# Patient Record
Sex: Male | Born: 1960 | Race: White | Hispanic: No | Marital: Married | Smoking: Never smoker
Health system: Southern US, Community
[De-identification: ages and names within clinical notes are randomized; demographics above are authoritative.]

## PROBLEM LIST (undated history)

## (undated) DIAGNOSIS — I2510 Atherosclerotic heart disease of native coronary artery without angina pectoris: Secondary | ICD-10-CM

## (undated) DIAGNOSIS — E785 Hyperlipidemia, unspecified: Secondary | ICD-10-CM

## (undated) DIAGNOSIS — I255 Ischemic cardiomyopathy: Secondary | ICD-10-CM

## (undated) DIAGNOSIS — I219 Acute myocardial infarction, unspecified: Secondary | ICD-10-CM

## (undated) HISTORY — PX: TONSILLECTOMY: SUR1361

---

## 2013-10-30 ENCOUNTER — Emergency Department (HOSPITAL_BASED_OUTPATIENT_CLINIC_OR_DEPARTMENT_OTHER): Payer: BC Managed Care – PPO

## 2013-10-30 ENCOUNTER — Inpatient Hospital Stay (HOSPITAL_BASED_OUTPATIENT_CLINIC_OR_DEPARTMENT_OTHER)
Admission: EM | Admit: 2013-10-30 | Discharge: 2013-11-01 | DRG: 247 | Disposition: A | Discharge status: Home / Self Care | Payer: BC Managed Care – PPO | Attending: Cardiovascular Disease | Admitting: Cardiovascular Disease

## 2013-10-30 ENCOUNTER — Encounter (HOSPITAL_COMMUNITY)
Admission: EM | Disposition: A | Discharge status: Home / Self Care | Payer: Self-pay | Source: Home / Self Care | Attending: Cardiovascular Disease

## 2013-10-30 ENCOUNTER — Encounter (HOSPITAL_BASED_OUTPATIENT_CLINIC_OR_DEPARTMENT_OTHER): Payer: Self-pay | Admitting: Emergency Medicine

## 2013-10-30 ENCOUNTER — Ambulatory Visit (HOSPITAL_COMMUNITY)
Admission: EM | Admit: 2013-10-30 | Payer: BC Managed Care – PPO | Source: Other Acute Inpatient Hospital | Admitting: Cardiovascular Disease

## 2013-10-30 ENCOUNTER — Other Ambulatory Visit: Payer: Self-pay

## 2013-10-30 DIAGNOSIS — I714 Abdominal aortic aneurysm, without rupture, unspecified: Secondary | ICD-10-CM | POA: Diagnosis present

## 2013-10-30 DIAGNOSIS — K219 Gastro-esophageal reflux disease without esophagitis: Secondary | ICD-10-CM | POA: Diagnosis present

## 2013-10-30 DIAGNOSIS — E785 Hyperlipidemia, unspecified: Secondary | ICD-10-CM | POA: Diagnosis present

## 2013-10-30 DIAGNOSIS — I2129 ST elevation (STEMI) myocardial infarction involving other sites: Secondary | ICD-10-CM

## 2013-10-30 DIAGNOSIS — I213 ST elevation (STEMI) myocardial infarction of unspecified site: Secondary | ICD-10-CM | POA: Diagnosis present

## 2013-10-30 DIAGNOSIS — Z955 Presence of coronary angioplasty implant and graft: Secondary | ICD-10-CM

## 2013-10-30 DIAGNOSIS — I359 Nonrheumatic aortic valve disorder, unspecified: Secondary | ICD-10-CM

## 2013-10-30 DIAGNOSIS — I2119 ST elevation (STEMI) myocardial infarction involving other coronary artery of inferior wall: Principal | ICD-10-CM | POA: Diagnosis present

## 2013-10-30 DIAGNOSIS — I2510 Atherosclerotic heart disease of native coronary artery without angina pectoris: Secondary | ICD-10-CM

## 2013-10-30 DIAGNOSIS — I219 Acute myocardial infarction, unspecified: Secondary | ICD-10-CM

## 2013-10-30 DIAGNOSIS — I959 Hypotension, unspecified: Secondary | ICD-10-CM | POA: Diagnosis not present

## 2013-10-30 DIAGNOSIS — I2589 Other forms of chronic ischemic heart disease: Secondary | ICD-10-CM | POA: Diagnosis present

## 2013-10-30 DIAGNOSIS — I255 Ischemic cardiomyopathy: Secondary | ICD-10-CM | POA: Diagnosis present

## 2013-10-30 HISTORY — PX: LEFT HEART CATH: SHX5478

## 2013-10-30 HISTORY — DX: Hyperlipidemia, unspecified: E78.5

## 2013-10-30 HISTORY — DX: Atherosclerotic heart disease of native coronary artery without angina pectoris: I25.10

## 2013-10-30 HISTORY — DX: Ischemic cardiomyopathy: I25.5

## 2013-10-30 HISTORY — DX: Acute myocardial infarction, unspecified: I21.9

## 2013-10-30 HISTORY — PX: OTHER SURGICAL HISTORY: SHX169

## 2013-10-30 HISTORY — PX: PERCUTANEOUS CORONARY STENT INTERVENTION (PCI-S): SHX5485

## 2013-10-30 LAB — HEMOGLOBIN A1C
HEMOGLOBIN A1C: 5.5 % (ref <5.7)
MEAN PLASMA GLUCOSE: 111 mg/dL (ref <117)

## 2013-10-30 LAB — TROPONIN I
Troponin I: >20 ng/mL (ref <0.30)
Troponin I: >20 ng/mL (ref <0.30)

## 2013-10-30 LAB — CBC
HEMATOCRIT: 41.2 % (ref 39.0–52.0)
HEMOGLOBIN: 14 g/dL (ref 13.0–17.0)
MCH: 30.7 pg (ref 26.0–34.0)
MCHC: 34 g/dL (ref 30.0–36.0)
MCV: 90.4 fL (ref 78.0–100.0)
Platelets: 152 10*3/uL (ref 150–400)
RBC: 4.56 MIL/uL (ref 4.22–5.81)
RDW: 13.5 % (ref 11.5–15.5)
WBC: 7.1 10*3/uL (ref 4.0–10.5)

## 2013-10-30 LAB — CBC WITH DIFFERENTIAL/PLATELET
Basophils Absolute: 0 10*3/uL (ref 0.0–0.1)
Basophils Relative: 1 % (ref 0–1)
Eosinophils Absolute: 0.2 10*3/uL (ref 0.0–0.7)
Eosinophils Relative: 2 % (ref 0–5)
HCT: 46 % (ref 39.0–52.0)
Hemoglobin: 15.9 g/dL (ref 13.0–17.0)
LYMPHS ABS: 3.7 10*3/uL (ref 0.7–4.0)
LYMPHS PCT: 45 % (ref 12–46)
MCH: 30.5 pg (ref 26.0–34.0)
MCHC: 34.6 g/dL (ref 30.0–36.0)
MCV: 88.1 fL (ref 78.0–100.0)
MONOS PCT: 8 % (ref 3–12)
Monocytes Absolute: 0.7 10*3/uL (ref 0.1–1.0)
Neutro Abs: 3.6 10*3/uL (ref 1.7–7.7)
Neutrophils Relative %: 44 % (ref 43–77)
Platelets: 188 10*3/uL (ref 150–400)
RBC: 5.22 MIL/uL (ref 4.22–5.81)
RDW: 13.5 % (ref 11.5–15.5)
WBC: 8.2 10*3/uL (ref 4.0–10.5)

## 2013-10-30 LAB — MRSA PCR SCREENING: MRSA by PCR: NEGATIVE

## 2013-10-30 LAB — BASIC METABOLIC PANEL
Anion gap: 15 (ref 5–15)
Anion gap: 12 (ref 5–15)
BUN: 22 mg/dL (ref 6–23)
BUN: 19 mg/dL (ref 6–23)
CHLORIDE: 102 meq/L (ref 96–112)
CHLORIDE: 104 meq/L (ref 96–112)
CO2: 25 meq/L (ref 19–32)
CO2: 22 meq/L (ref 19–32)
Calcium: 10.1 mg/dL (ref 8.4–10.5)
Calcium: 9 mg/dL (ref 8.4–10.5)
Creatinine, Ser: 1.2 mg/dL (ref 0.50–1.35)
Creatinine, Ser: 0.95 mg/dL (ref 0.50–1.35)
GFR calc Af Amer: 79 mL/min — ABNORMAL LOW (ref >90)
GFR calc Af Amer: >90 mL/min (ref >90)
GFR calc non Af Amer: 68 mL/min — ABNORMAL LOW (ref >90)
GFR calc non Af Amer: >90 mL/min (ref >90)
Glucose, Bld: 117 mg/dL — ABNORMAL HIGH (ref 70–99)
Glucose, Bld: 99 mg/dL (ref 70–99)
Potassium: 3.8 mEq/L (ref 3.7–5.3)
Potassium: 4.2 mEq/L (ref 3.7–5.3)
Sodium: 142 mEq/L (ref 137–147)
Sodium: 138 mEq/L (ref 137–147)

## 2013-10-30 LAB — PROTIME-INR
INR: 1.02 (ref 0.00–1.49)
Prothrombin Time: 13.4 seconds (ref 11.6–15.2)

## 2013-10-30 LAB — LIPID PANEL
Cholesterol: 216 mg/dL — ABNORMAL HIGH (ref 0–200)
HDL: 38 mg/dL — AB (ref >39)
LDL Cholesterol: 146 mg/dL — ABNORMAL HIGH (ref 0–99)
Total CHOL/HDL Ratio: 5.7 RATIO
Triglycerides: 159 mg/dL — ABNORMAL HIGH (ref <150)
VLDL: 32 mg/dL (ref 0–40)

## 2013-10-30 LAB — POCT ACTIVATED CLOTTING TIME: ACTIVATED CLOTTING TIME: 422 s

## 2013-10-30 SURGERY — LEFT HEART CATH

## 2013-10-30 MED ORDER — PRASUGREL HCL 10 MG PO TABS
ORAL_TABLET | ORAL | Status: AC
  Filled 2013-10-30: qty 1

## 2013-10-30 MED ORDER — NITROGLYCERIN 1 MG/10 ML FOR IR/CATH LAB
INTRA_ARTERIAL | Status: AC
  Filled 2013-10-30: qty 10

## 2013-10-30 MED ORDER — HEPARIN SODIUM (PORCINE) 5000 UNIT/ML IJ SOLN
4000.0000 [IU] | Freq: Once | INTRAMUSCULAR | Status: AC
  Administered 2013-10-30: 4000 [IU] via INTRAVENOUS

## 2013-10-30 MED ORDER — SODIUM CHLORIDE 0.9 % IJ SOLN: 3.0000 mL | INTRAMUSCULAR | Status: DC | PRN

## 2013-10-30 MED ORDER — FENTANYL CITRATE 0.05 MG/ML IJ SOLN
100.0000 ug | Freq: Once | INTRAMUSCULAR | Status: AC
  Administered 2013-10-30: 100 ug via INTRAVENOUS
  Filled 2013-10-30: qty 2

## 2013-10-30 MED ORDER — SODIUM CHLORIDE 0.9 % IJ SOLN
3.0000 mL | Freq: Two times a day (BID) | INTRAMUSCULAR | Status: DC
  Administered 2013-10-31: 09:00:00 via INTRAVENOUS

## 2013-10-30 MED ORDER — ASPIRIN 81 MG PO CHEW
81.0000 mg | CHEWABLE_TABLET | ORAL | Status: AC
  Administered 2013-10-31: 81 mg via ORAL
  Filled 2013-10-30: qty 1

## 2013-10-30 MED ORDER — TICAGRELOR 90 MG PO TABS
90.0000 mg | ORAL_TABLET | Freq: Once | ORAL | Status: DC
  Filled 2013-10-30: qty 1

## 2013-10-30 MED ORDER — HEPARIN SODIUM (PORCINE) 5000 UNIT/ML IJ SOLN
INTRAMUSCULAR | Status: AC
  Administered 2013-10-30: 4000 [IU] via INTRAVENOUS
  Filled 2013-10-30: qty 1

## 2013-10-30 MED ORDER — PRASUGREL HCL 10 MG PO TABS
ORAL_TABLET | ORAL | Status: AC
Start: 2013-10-30 — End: 2013-10-30
  Filled 2013-10-30: qty 1

## 2013-10-30 MED ORDER — ONDANSETRON HCL 4 MG/2ML IJ SOLN
INTRAMUSCULAR | Status: AC
  Filled 2013-10-30: qty 2

## 2013-10-30 MED ORDER — PRASUGREL HCL 10 MG PO TABS
10.0000 mg | ORAL_TABLET | Freq: Every day | ORAL | Status: DC
  Administered 2013-10-30 – 2013-11-01 (×2): 10 mg via ORAL
  Filled 2013-10-30 (×3): qty 1

## 2013-10-30 MED ORDER — HEPARIN (PORCINE) IN NACL 100-0.45 UNIT/ML-% IJ SOLN
14.0000 [IU]/kg/h | INTRAMUSCULAR | Status: DC
  Administered 2013-10-30: 14 [IU]/kg/h via INTRAVENOUS

## 2013-10-30 MED ORDER — SODIUM CHLORIDE 0.9 % IV SOLN
1.0000 mL/kg/h | INTRAVENOUS | Status: DC
  Administered 2013-10-30 – 2013-10-31 (×2): 1 mL/kg/h via INTRAVENOUS

## 2013-10-30 MED ORDER — MORPHINE SULFATE 2 MG/ML IJ SOLN: 2.0000 mg | INTRAMUSCULAR | Status: DC | PRN

## 2013-10-30 MED ORDER — SODIUM CHLORIDE 0.9 % IV BOLUS (SEPSIS)
1000.0000 mL | Freq: Once | INTRAVENOUS | Status: AC
  Administered 2013-10-30: 1000 mL via INTRAVENOUS

## 2013-10-30 MED ORDER — ASPIRIN EC 81 MG PO TBEC
81.0000 mg | DELAYED_RELEASE_TABLET | Freq: Every day | ORAL | Status: DC
Start: 2013-10-31 — End: 2013-11-01
  Administered 2013-11-01: 81 mg via ORAL
  Filled 2013-10-30 (×2): qty 1

## 2013-10-30 MED ORDER — FENTANYL CITRATE 0.05 MG/ML IJ SOLN
INTRAMUSCULAR | Status: AC
  Filled 2013-10-30: qty 2

## 2013-10-30 MED ORDER — HEPARIN (PORCINE) IN NACL 2-0.9 UNIT/ML-% IJ SOLN
INTRAMUSCULAR | Status: AC
  Filled 2013-10-30: qty 1000

## 2013-10-30 MED ORDER — NITROGLYCERIN 0.4 MG SL SUBL: 0.4000 mg | SUBLINGUAL_TABLET | SUBLINGUAL | Status: DC | PRN

## 2013-10-30 MED ORDER — OXYCODONE-ACETAMINOPHEN 5-325 MG PO TABS
1.0000 | ORAL_TABLET | ORAL | Status: DC | PRN
  Filled 2013-10-30 (×4): qty 2

## 2013-10-30 MED ORDER — SODIUM CHLORIDE 0.9 % IJ SOLN
3.0000 mL | Freq: Two times a day (BID) | INTRAMUSCULAR | Status: DC
  Administered 2013-10-31: 09:00:00 via INTRAVENOUS
  Administered 2013-10-31: 3 mL via INTRAVENOUS

## 2013-10-30 MED ORDER — ACETAMINOPHEN 325 MG PO TABS: 650.0000 mg | ORAL_TABLET | ORAL | Status: DC | PRN

## 2013-10-30 MED ORDER — SODIUM CHLORIDE 0.9 % IV SOLN: 250.0000 mL | INTRAVENOUS | Status: DC | PRN

## 2013-10-30 MED ORDER — ASPIRIN 81 MG PO CHEW
CHEWABLE_TABLET | ORAL | Status: AC
  Administered 2013-10-30: 324 mg via ORAL
  Filled 2013-10-30: qty 4

## 2013-10-30 MED ORDER — ONDANSETRON HCL 4 MG/2ML IJ SOLN: 4.0000 mg | Freq: Four times a day (QID) | INTRAMUSCULAR | Status: DC | PRN

## 2013-10-30 MED ORDER — ENOXAPARIN SODIUM 40 MG/0.4ML ~~LOC~~ SOLN
40.0000 mg | SUBCUTANEOUS | Status: DC
  Administered 2013-10-30: 40 mg via SUBCUTANEOUS
  Filled 2013-10-30 (×2): qty 0.4

## 2013-10-30 MED ORDER — ASPIRIN 81 MG PO CHEW
324.0000 mg | CHEWABLE_TABLET | Freq: Once | ORAL | Status: AC
  Administered 2013-10-30: 324 mg via ORAL

## 2013-10-30 MED ORDER — MIDAZOLAM HCL 2 MG/2ML IJ SOLN
INTRAMUSCULAR | Status: AC
  Filled 2013-10-30: qty 2

## 2013-10-30 MED ORDER — FENTANYL CITRATE 0.05 MG/ML IJ SOLN
100.0000 ug | Freq: Once | INTRAMUSCULAR | Status: AC
  Administered 2013-10-30: 100 ug via INTRAVENOUS

## 2013-10-30 MED ORDER — PRASUGREL HCL 10 MG PO TABS
10.0000 mg | ORAL_TABLET | ORAL | Status: AC
  Administered 2013-10-31: 10 mg via ORAL
  Filled 2013-10-30: qty 1

## 2013-10-30 MED ORDER — LIDOCAINE HCL (PF) 1 % IJ SOLN
INTRAMUSCULAR | Status: AC
  Filled 2013-10-30: qty 30

## 2013-10-30 MED ORDER — METOPROLOL TARTRATE 1 MG/ML IV SOLN
2.5000 mg | Freq: Once | INTRAVENOUS | Status: AC
  Administered 2013-10-30: 2.5 mg via INTRAVENOUS
  Filled 2013-10-30: qty 5

## 2013-10-30 MED ORDER — ONDANSETRON HCL 4 MG/2ML IJ SOLN
4.0000 mg | Freq: Once | INTRAMUSCULAR | Status: AC
  Administered 2013-10-30: 4 mg via INTRAVENOUS

## 2013-10-30 MED ORDER — VERAPAMIL HCL 2.5 MG/ML IV SOLN
INTRAVENOUS | Status: AC
  Filled 2013-10-30: qty 2

## 2013-10-30 MED ORDER — FENTANYL CITRATE 0.05 MG/ML IJ SOLN
INTRAMUSCULAR | Status: AC
  Administered 2013-10-30: 100 ug via INTRAVENOUS
  Filled 2013-10-30: qty 2

## 2013-10-30 MED ORDER — HEPARIN (PORCINE) IN NACL 100-0.45 UNIT/ML-% IJ SOLN
INTRAMUSCULAR | Status: AC
  Administered 2013-10-30: 14 [IU]/kg/h via INTRAVENOUS
  Filled 2013-10-30: qty 250

## 2013-10-30 MED ORDER — ATORVASTATIN CALCIUM 80 MG PO TABS
80.0000 mg | ORAL_TABLET | Freq: Every day | ORAL | Status: DC
Start: 2013-10-30 — End: 2013-11-01
  Administered 2013-10-30 – 2013-10-31 (×2): 80 mg via ORAL
  Filled 2013-10-30 (×3): qty 1

## 2013-10-30 MED ORDER — SODIUM CHLORIDE 0.9 % IV SOLN
1.0000 mL/kg/h | INTRAVENOUS | Status: AC
  Administered 2013-10-30: 1 mL/kg/h via INTRAVENOUS

## 2013-10-30 MED ORDER — BIVALIRUDIN 250 MG IV SOLR
INTRAVENOUS | Status: AC
  Filled 2013-10-30: qty 250

## 2013-10-30 NOTE — ED Notes (Signed)
Report given to jon, RN at [facility]

## 2013-10-30 NOTE — Progress Notes (Signed)
Echo Lab  2D Echocardiogram completed.  Kenny Rea L Evellyn Tuff, RDCS 10/30/2013 9:40 AM

## 2013-10-30 NOTE — Interval H&P Note (Signed)
History and Physical Interval Note:  10/30/2013 3:53 AM  Jimmy Goodwin  has presented today for surgery, with the diagnosis of stemi  The various methods of treatment have been discussed with the patient and family. After consideration of risks, benefits and other options for treatment, the patient has consented to  Procedure(s) with comments: LEFT HEART CATH (N/A) PERCUTANEOUS CORONARY STENT INTERVENTION (PCI-S) - RCA as a surgical intervention .  The patient's history has been reviewed, patient examined, no change in status, stable for surgery.  I have reviewed the patient's chart and labs.  Questions were answered to the patient's satisfaction.    Cath Lab Visit (complete for each Cath Lab visit)  Clinical Evaluation Leading to the Procedure:   ACS: Yes.    Non-ACS:    Anginal Classification: CCS IV  Anti-ischemic medical therapy: No Therapy  Non-Invasive Test Results: No non-invasive testing performed  Prior CABG: No previous CABG       [name]

## 2013-10-30 NOTE — Progress Notes (Signed)
UR Completed.  Reneta Niehaus Jane [phone] 10/30/2013  

## 2013-10-30 NOTE — CV Procedure (Signed)
    Cardiac Catheterization Procedure Note  Name: Jimmy Goodwin MRN: [id number] DOB: 1960-09-04  Procedure: Left Heart Cath, Selective Coronary Angiography, LV angiography, PTCA and stenting of the RCA  Indication: Inferior STEMI  Procedural Details:  The right wrist was prepped, draped, and anesthetized with 1% lidocaine. Using the modified Seldinger technique, a 5/6 French Slender sheath was introduced into the right radial artery. 3 mg of verapamil was administered through the sheath, weight-based unfractionated heparin was administered intravenously. Standard Judkins catheters were used for selective coronary angiography and left ventriculography. Ventriculography was performed after PCI.  Catheter exchanges were performed over an exchange length guidewire.  PROCEDURAL FINDINGS Hemodynamics: AO 87/55  LV 86/22   Coronary angiography: Coronary dominance: right  Left mainstem: Widely patent, no obstructive disease  Left anterior descending (LAD): Mild proximal stenosis diffuse 30%, otherwise widely patent without significant obstruction  Left circumflex (LCx): 90% proximal LCx at 90 degree bend, otherwise widely patent. Single large OM is present.  Right coronary artery (RCA): mild-moderate calcification, 99% stenosis in the distal RCA. PDA and PLA branches are patent. TIMI-2 flow initially. PDA and PLA branches are patent.   Left ventriculography: Basal and mid-inferior akinesis, LVEF 40-45%.  PCI Note:  Following the diagnostic procedure, the decision was made to proceed with PCI of the distal RCA.  Weight-based bivalirudin was given for anticoagulation. Effient 60 mg PO was administered. Once a therapeutic ACT was achieved, a 6 [place] JR4 guide catheter was inserted.  A cougar coronary guidewire was used to cross the lesion.  The lesion was predilated with a 2.5x15 mm balloon.  The lesion was then stented with a 3.5x20 mm Promus DES.  The stent was not post-dilated because of a  step-up and step-down was present off the proximal and distal ends of the stent. Following PCI, there was 0% residual stenosis and TIMI-3 flow. Final angiography confirmed an excellent result. The patient tolerated the procedure well. There were no immediate procedural complications. A TR band was used for radial hemostasis. The patient was transferred to the post catheterization recovery area for further monitoring.  PCI Data: Vessel - RCA/Segment - distal Percent Stenosis (pre)  99 TIMI-flow 2 Stent 3.5x20 mm Promus DES Percent Stenosis (post) 0 TIMI-flow (post) 3  Contrast: 125 cc Omnipaque  Fluoro time: 9.2 min   Estimated Blood Loss: minimal  Final Conclusions:   1. Acute inferior wall MI secondary to subtotal occlusion of the distal RCA, treated successfully with primary PCI using a DES platform 2. Severe residual LCx stenosis 3. Nonobstructive LAD stenosis 4. Mild segmental LV systolic dysfunction   Recommendations:  Staged PCI of the LCx Wednesday if stable, anticipate d/c home Thursday. With large area of myocardium and severe stenosis of the proximal LCx, favor PCI rather than functional study or med Rx. ASA/Effient x 12 months. Aggressive risk reduction efforts.  [name] MD, [facility] 10/30/2013, 3:54 AM

## 2013-10-30 NOTE — Progress Notes (Signed)
Paged for code stemi, patient coming from [facility] with an eta of 20 minutes. Went to cath lab and got information from EMS as to who would be coming. Was told that wife was on here way. Went to ED to have them page me when she arrived. Assisted her in getting her to right waiting room. Keep patient wife informed as to when cath would be over. Sat with her til doctor came to talk with her. Patient will have to have another stint possibly tomorrow.

## 2013-10-30 NOTE — Progress Notes (Signed)
Subjective:  No CP/SOB. S/P inferior STEMI Rx with DES RCA with residual Dz LCX  Objective:  Temp:  [98.1 F (36.7 C)-98.7 F (37.1 C)] 98.6 F (37 C) (08/04 0738) Pulse Rate:  [56-79] 65 (08/04 0738) Resp:  [10-19] 13 (08/04 0738) BP: (97-137)/(53-95) 103/53 mmHg (08/04 0738) SpO2:  [97 %-100 %] 97 % (08/04 0738) Weight:  [205 lb (92.987 kg)] 205 lb (92.987 kg) (08/04 0202) Weight change:   Intake/Output from previous day: 08/03 0701 - 08/04 0700 In: 372 [I.V.:372] Out: 200 [Urine:200]  Intake/Output from this shift: Total I/O In: 93 [I.V.:93] Out: -   Physical Exam: General appearance: alert and no distress Neck: no adenopathy, no carotid bruit, no JVD, supple, symmetrical, trachea midline and thyroid not enlarged, symmetric, no tenderness/mass/nodules Lungs: clear to auscultation bilaterally Heart: regular rate and rhythm, S1, S2 normal, no murmur, click, rub or gallop Extremities: extremities normal, atraumatic, no cyanosis or edema and RRA puncture site OK  Lab Results: Results for orders placed during the hospital encounter of 10/30/13 (from the past 48 hour(s))  CBC WITH DIFFERENTIAL     Status: None   Collection Time    10/30/13  2:09 AM      Result Value Ref Range   WBC 8.2  4.0 - 10.5 K/uL   RBC 5.22  4.22 - 5.81 MIL/uL   Hemoglobin 15.9  13.0 - 17.0 g/dL   HCT 46.0  39.0 - 52.0 %   MCV 88.1  78.0 - 100.0 fL   MCH 30.5  26.0 - 34.0 pg   MCHC 34.6  30.0 - 36.0 g/dL   RDW 13.5  11.5 - 15.5 %   Platelets 188  150 - 400 K/uL   Neutrophils Relative % 44  43 - 77 %   Neutro Abs 3.6  1.7 - 7.7 K/uL   Lymphocytes Relative 45  12 - 46 %   Lymphs Abs 3.7  0.7 - 4.0 K/uL   Monocytes Relative 8  3 - 12 %   Monocytes Absolute 0.7  0.1 - 1.0 K/uL   Eosinophils Relative 2  0 - 5 %   Eosinophils Absolute 0.2  0.0 - 0.7 K/uL   Basophils Relative 1  0 - 1 %   Basophils Absolute 0.0  0.0 - 0.1 K/uL  BASIC METABOLIC PANEL     Status: Abnormal   Collection Time     10/30/13  2:09 AM      Result Value Ref Range   Sodium 142  137 - 147 mEq/L   Potassium 3.8  3.7 - 5.3 mEq/L   Chloride 102  96 - 112 mEq/L   CO2 25  19 - 32 mEq/L   Glucose, Bld 117 (*) 70 - 99 mg/dL   BUN 22  6 - 23 mg/dL   Creatinine, Ser 1.20  0.50 - 1.35 mg/dL   Calcium 10.1  8.4 - 10.5 mg/dL   GFR calc non Af Amer 68 (*) >90 mL/min   GFR calc Af Amer 79 (*) >90 mL/min   Comment: (NOTE)     The eGFR has been calculated using the CKD EPI equation.     This calculation has not been validated in all clinical situations.     eGFR's persistently <90 mL/min signify possible Chronic Kidney     Disease.   Anion gap 15  5 - 15  TROPONIN I     Status: None   Collection Time    10/30/13  2:09 AM      Result Value Ref Range   Troponin I <0.30  <0.30 ng/mL   Comment:            Due to the release kinetics of cTnI,     a negative result within the first hours     of the onset of symptoms does not rule out     myocardial infarction with certainty.     If myocardial infarction is still suspected,     repeat the test at appropriate intervals.  PROTIME-INR     Status: None   Collection Time    10/30/13  2:09 AM      Result Value Ref Range   Prothrombin Time 13.4  11.6 - 15.2 seconds   INR 1.02  0.00 - 1.49  MRSA PCR SCREENING     Status: None   Collection Time    10/30/13  4:12 AM      Result Value Ref Range   MRSA by PCR NEGATIVE  NEGATIVE   Comment:            The GeneXpert MRSA Assay (FDA     approved for NASAL specimens     only), is one component of a     comprehensive MRSA colonization     surveillance program. It is not     intended to diagnose MRSA     infection nor to guide or     monitor treatment for     MRSA infections.  CBC     Status: None   Collection Time    10/30/13  8:00 AM      Result Value Ref Range   WBC 7.1  4.0 - 10.5 K/uL   RBC 4.56  4.22 - 5.81 MIL/uL   Hemoglobin 14.0  13.0 - 17.0 g/dL   HCT 41.2  39.0 - 52.0 %   MCV 90.4  78.0 - 100.0 fL    MCH 30.7  26.0 - 34.0 pg   MCHC 34.0  30.0 - 36.0 g/dL   RDW 13.5  11.5 - 15.5 %   Platelets 152  150 - 400 K/uL    Imaging: Imaging results have been reviewed  Assessment/Plan:   1. Active Problems: 2.   STEMI (ST elevation myocardial infarction) 3.   Time Spent Directly with Patient:  20 minutes  Length of Stay:  LOS: 0 days   Looks great Day #0 Inf STEMI Rx with DES Dom RCA radially by Dr. Billee Cashing. Residual LCX disease. BB and ACE-I being held for soft BP. Labs OK. Plan staged LCX intervention tomorrow and possibly D/C home on Thursday  Jimmy Goodwin,Jimmy Goodwin 10/30/2013, 8:30 AM

## 2013-10-30 NOTE — H&P (Addendum)
Cardiology Consultation Note  Patient ID: Arminda ResidesRonald Heimsoth, MRN: [id number], DOB/AGE: Mar 07, 1961 53 y.o. Admit date: 10/30/2013   Date of Consult: 10/30/2013 Primary Physician: No primary provider on file. Primary Cardiologist: Nill   Chief Complaint: ACS    Assessment and Plan:  Acute Inferior STEMI    Plan  Aspirin , heparin , emergent LHC pending   b-blocker BP permitting Start high intensity statin therapu Check echocardiogram in am  admit to CCU  Check lipids and HgA1c Further orders by Dr Excell Seltzerooper pending   53 yr old male presents with chest pain  HPI: pt states that he had been experiencing what he thought as GERD symptoms for the past 1 month . These are described as substernal burning/ pressure radiating to neck worse with exertion and relieved partially with antacids. These were intermittent today was much more severe and persistent and he decided to come into the ER. He denies any other cardiac co-morbids, FH , or previous cardiac workup  In the ER he was given fentanyl, zofran , heparin and aspirin. Currently he rates his pain as 3/10 . EKG shows significant inferior ST Elevations.  ROS ; No orthopnea, PND , LE edema , DOE,  focal weakness, syncope, bleeding diathesis , claudication , palpitation etc .  Reports medication compliance  History reviewed. No pertinent past medical history.    Most Recent Cardiac Studies: 10/30/2013 NSR, inferor post MI with reciprocal changes in lateral leads    Surgical History:  Past Surgical History  Procedure Laterality Date  . Tonsillectomy       Home Meds: Prior to Admission medications   Not on File    Inpatient Medications:    . heparin 14 Units/kg/hr (10/30/13 0218)    Allergies: No Known Allergies  History   Social History  . Marital Status: Married    Spouse Name: N/A    Number of Children: N/A  . Years of Education: N/A   Occupational History  . Not on file.   Social History Main Topics  . Smoking status:  Never Smoker   . Smokeless tobacco: Not on file  . Alcohol Use: Yes  . Drug Use: No  . Sexual Activity: Not on file   Other Topics Concern  . Not on file   Social History Narrative  . No narrative on file     History reviewed. No pertinent family history.   Review of Systems: General: negative for chills, fever, night sweats or weight changes.  Cardiovascular:per HPI  Dermatological: negative for rash Respiratory: negative for cough or wheezing Urologic: negative for hematuria Abdominal: negative for nausea, vomiting, diarrhea, bright red blood per rectum, melena, or hematemesis Neurologic: negative for visual changes, syncope, or dizziness All other systems reviewed and are otherwise negative except as noted above.  Labs: No results found for this basename: CKTOTAL, CKMB, TROPONINI,  in the last 72 hours Lab Results  Component Value Date   WBC 8.2 10/30/2013   HGB 15.9 10/30/2013   HCT 46.0 10/30/2013   MCV 88.1 10/30/2013   PLT 188 10/30/2013   No results found for this basename: NA, K, CL, CO2, BUN, CREATININE, CALCIUM, LABALBU, PROT, BILITOT, ALKPHOS, ALT, AST, GLUCOSE,  in the last 168 hours No results found for this basename: CHOL, HDL, LDLCALC, TRIG   No results found for this basename: DDIMER    Radiology/Studies:  Dg Chest Portable 1 View  10/30/2013   CLINICAL DATA:  Chest pain.  Code STEMI.  EXAM: PORTABLE CHEST -  1 VIEW  COMPARISON:  None.  FINDINGS: Normal heart size and mediastinal contours. No acute infiltrate or edema. No effusion or pneumothorax. No acute osseous findings.  IMPRESSION: No active disease.   Electronically Signed   By: Tiburcio Pea M.D.   On: 10/30/2013 02:25      Physical Exam: Blood pressure 129/88, pulse 72, temperature 98.1 F (36.7 C), temperature source Oral, resp. rate 13, height 5\' 9"  (1.753 m), weight 92.987 kg (205 lb), SpO2 100.00%. General: mildly anxious Neck: Negative for carotid bruits. JVD not elevated. Lungs: Clear  bilaterally to auscultation without wheezes, rales, or rhonchi. Breathing is unlabored. Heart: RRR with S1 S2. No murmurs, rubs, or gallops appreciated. Abdomen: Soft, non-tender, non-distended with normoactive bowel sounds. No hepatomegaly. No rebound/guarding. No obvious abdominal masses. Extremities: No clubbing or cyanosis. No edema.  Distal pedal pulses are 2+ and equal bilaterally. Neuro: Alert and oriented X 3. No facial asymmetry. No focal deficit. Moves all extremities spontaneously. Psych:  Responds to questions appropriately with a normal affect.       Lovina Reach, A M.D  10/30/2013, 2:35 AM

## 2013-10-30 NOTE — ED Notes (Signed)
Chest pain that runs up into his throat, c/o issues with acid reflux in the past, Pt states he takes no meds but no relief from tums tonight

## 2013-10-30 NOTE — ED Provider Notes (Signed)
CSN: [id number]     Arrival date & time 10/30/13  0154 History   First MD Initiated Contact with Patient 10/30/13 0211     Chief Complaint  Patient presents with  . Chest Pain     (Consider location/radiation/quality/duration/timing/severity/associated sxs/prior Treatment) Patient is a 53 y.o. male presenting with chest pain. The history is provided by the patient.  Chest Pain Pain location:  Substernal area Pain quality: burning and dull   Pain quality comment:  Runs to the throat, patient states he thinks it is acid reflux has had several episodes recently lasting 15 or so minutes went away on it's own.  Tonight 3 hours duration at rest.  Maybe mild DOE recently Radiates to: throat. Pain radiates to the back: no   Pain severity:  Severe Onset quality:  Sudden Duration:  3 hours Timing:  Constant Progression:  Unchanged Chronicity:  Recurrent Context: at rest   Relieved by:  Nothing Worsened by:  Nothing tried Ineffective treatments:  Antacids Associated symptoms: no back pain, no cough, no fever, no palpitations, not vomiting and no weakness   Risk factors: male sex   Risk factors: no smoking   Patient reports he has no PMH.  He has had several episodes of "acid reflux" at rest in the past several weeks usually these go away on own in 15 or so minutes.  Tonight ate a late dinner at 9 pm of meatloaf potatoes and green beans.  1 hour lateral had this pain in chest to the throat.  Unresponsive to tums and [employer] brand acid reducer.  After 3 hours [facility].  Denies SOB, n/v/d.  Has had minimal DOE in prior weeks.  No back nor abdominal pain.    History reviewed. No pertinent past medical history. Past Surgical History  Procedure Laterality Date  . Tonsillectomy     History reviewed. No pertinent family history. History  Substance Use Topics  . Smoking status: Never Smoker   . Smokeless tobacco: Not on file  . Alcohol Use: Yes    Review of Systems   Constitutional: Negative for fever.  Respiratory: Negative for cough.   Cardiovascular: Positive for chest pain. Negative for palpitations and leg swelling.  Gastrointestinal: Negative for vomiting.  Musculoskeletal: Negative for back pain.  Neurological: Negative for weakness.  All other systems reviewed and are negative.     Allergies  Review of patient's allergies indicates no known allergies.  Home Medications   Prior to Admission medications   Not on File   BP 129/88  Pulse 72  Temp(Src) 98.1 F (36.7 C) (Oral)  Resp 13  Ht 5\' 9"  (1.753 m)  Wt 205 lb (92.987 kg)  BMI 30.26 kg/m2  SpO2 100% Physical Exam  Constitutional: He is oriented to person, place, and time. He appears well-developed and well-nourished.  Stoic, ashen  HENT:  Head: Normocephalic and atraumatic.  Mouth/Throat: Oropharynx is clear and moist.  Eyes: Conjunctivae are normal. Pupils are equal, round, and reactive to light.  Neck: Normal range of motion. Neck supple.  Cardiovascular: Normal rate, regular rhythm and intact distal pulses.   Pulmonary/Chest: Effort normal and breath sounds normal. No respiratory distress. He has no wheezes. He has no rales.  Abdominal: Soft. Bowel sounds are normal. There is no tenderness. There is no rebound and no guarding.  Musculoskeletal: Normal range of motion. He exhibits no edema.  Neurological: He is alert and oriented to person, place, and time.  Skin: He is diaphoretic.  Psychiatric: He has a normal mood and affect.    ED Course  Procedures (including critical care time) Labs Review Labs Reviewed  CBC WITH DIFFERENTIAL  PROTIME-INR  BASIC METABOLIC PANEL  TROPONIN I   Results for orders placed during the hospital encounter of 10/30/13  CBC WITH DIFFERENTIAL      Result Value Ref Range   WBC 8.2  4.0 - 10.5 K/uL   RBC 5.22  4.22 - 5.81 MIL/uL   Hemoglobin 15.9  13.0 - 17.0 g/dL   HCT 45.4  09.8 - 11.9 %   MCV 88.1  78.0 - 100.0 fL   MCH 30.5   26.0 - 34.0 pg   MCHC 34.6  30.0 - 36.0 g/dL   RDW 14.7  82.9 - 56.2 %   Platelets 188  150 - 400 K/uL   Neutrophils Relative % 44  43 - 77 %   Neutro Abs 3.6  1.7 - 7.7 K/uL   Lymphocytes Relative 45  12 - 46 %   Lymphs Abs 3.7  0.7 - 4.0 K/uL   Monocytes Relative 8  3 - 12 %   Monocytes Absolute 0.7  0.1 - 1.0 K/uL   Eosinophils Relative 2  0 - 5 %   Eosinophils Absolute 0.2  0.0 - 0.7 K/uL   Basophils Relative 1  0 - 1 %   Basophils Absolute 0.0  0.0 - 0.1 K/uL  BASIC METABOLIC PANEL      Result Value Ref Range   Sodium 142  137 - 147 mEq/L   Potassium 3.8  3.7 - 5.3 mEq/L   Chloride 102  96 - 112 mEq/L   CO2 25  19 - 32 mEq/L   Glucose, Bld 117 (*) 70 - 99 mg/dL   BUN 22  6 - 23 mg/dL   Creatinine, Ser 1.30  0.50 - 1.35 mg/dL   Calcium 86.5  8.4 - 78.4 mg/dL   GFR calc non Af Amer 68 (*) >90 mL/min   GFR calc Af Amer 79 (*) >90 mL/min   Anion gap 15  5 - 15  TROPONIN I      Result Value Ref Range   Troponin I <0.30  <0.30 ng/mL  PROTIME-INR      Result Value Ref Range   Prothrombin Time 13.4  11.6 - 15.2 seconds   INR 1.02  0.00 - 1.49   Dg Chest Portable 1 View  10/30/2013   CLINICAL DATA:  Chest pain.  Code STEMI.  EXAM: PORTABLE CHEST - 1 VIEW  COMPARISON:  None.  FINDINGS: Normal heart size and mediastinal contours. No acute infiltrate or edema. No effusion or pneumothorax. No acute osseous findings.  IMPRESSION: No active disease.   Electronically Signed   By: Tiburcio Pea M.D.   On: 10/30/2013 02:25     Imaging Review Dg Chest Portable 1 View  10/30/2013   CLINICAL DATA:  Chest pain.  Code STEMI.  EXAM: PORTABLE CHEST - 1 VIEW  COMPARISON:  None.  FINDINGS: Normal heart size and mediastinal contours. No acute infiltrate or edema. No effusion or pneumothorax. No acute osseous findings.  IMPRESSION: No active disease.   Electronically Signed   By: Tiburcio Pea M.D.   On: 10/30/2013 02:25     Date: 10/30/2013 206 am read by and stemi activated immediately.   Read in muse at 209 am  Rate: 60  Rhythm: normal sinus rhythm  QRS Axis: normal  Intervals: normal  ST/T Wave abnormalities: acute  myocardial infarction inferior MI with RV posterior involvement  Conduction Disutrbances:none  Narrative Interpretation: acute IMI with posterior involvement reciprocal changes in 1 and AVL  Old EKG Reviewed: none available    MDM   Final diagnoses:  ST elevation myocardial infarction (STEMI) involving other coronary artery    Medications  heparin ADULT infusion 100 units/mL (25000 units/250 mL) (14 Units/kg/hr  93 kg Intravenous New Bag/Given 10/30/13 0218)  aspirin chewable tablet 324 mg (324 mg Oral Given 10/30/13 0214)  fentaNYL (SUBLIMAZE) injection 100 mcg (100 mcg Intravenous Given 10/30/13 0215)  heparin injection 4,000 Units (4,000 Units Intravenous Given 10/30/13 0219)  metoprolol (LOPRESSOR) injection 2.5 mg (2.5 mg Intravenous Given 10/30/13 0223)  sodium chloride 0.9 % bolus 1,000 mL (1,000 mLs Intravenous New Bag/Given 10/30/13 0219)  fentaNYL (SUBLIMAZE) injection 100 mcg (100 mcg Intravenous Given 10/30/13 0224)  ondansetron (ZOFRAN) injection 4 mg (4 mg Intravenous Given 10/30/13 0222)  Code STEMI activated immediately, EKG not confirmed in muse immediately due to interventions in patient's room.  [facility] contacted for transfer.    210 Case d/w Dr. Ranae Palms in ED, EDP is aware of patient if cath team not ready  217 case d/w Dr. Excell Seltzer, cardiology who is on call cath attending at [facility] who is awaiting transfer.  Cath team in house.    MDM Reviewed: nursing note and vitals Interpretation: labs, ECG and x-ray (negative troponin no acute cardiopulmonary by me) Total time providing critical care: 30-74 minutes. This excludes time spent performing separately reportable procedures and services. Consults: cardiology  NTG not given due to RV involvement and concerns for hypotension.    CRITICAL CARE Performed by: Jasmine Awe Total critical care  time: 31 minutes Critical care time was exclusive of separately billable procedures and treating other patients. Critical care was necessary to treat or prevent imminent or life-threatening deterioration. Critical care was time spent personally by me on the following activities: development of treatment plan with patient and/or surrogate as well as nursing, discussions with consultants, evaluation of patient's response to treatment, examination of patient, obtaining history from patient or surrogate, ordering and performing treatments and interventions, ordering and review of laboratory studies, ordering and review of radiographic studies, pulse oximetry and re-evaluation of patient's condition.  Brilenta not in pixis at [facility]   Nicholi Ghuman Smitty Cords, MD 10/30/13 [phone]

## 2013-10-31 ENCOUNTER — Other Ambulatory Visit: Payer: Self-pay

## 2013-10-31 ENCOUNTER — Encounter (HOSPITAL_COMMUNITY)
Admission: EM | Disposition: A | Discharge status: Home / Self Care | Payer: BC Managed Care – PPO | Source: Home / Self Care | Attending: Cardiovascular Disease

## 2013-10-31 DIAGNOSIS — I255 Ischemic cardiomyopathy: Secondary | ICD-10-CM | POA: Diagnosis present

## 2013-10-31 DIAGNOSIS — I2510 Atherosclerotic heart disease of native coronary artery without angina pectoris: Secondary | ICD-10-CM

## 2013-10-31 DIAGNOSIS — I213 ST elevation (STEMI) myocardial infarction of unspecified site: Secondary | ICD-10-CM | POA: Diagnosis present

## 2013-10-31 DIAGNOSIS — E785 Hyperlipidemia, unspecified: Secondary | ICD-10-CM | POA: Diagnosis present

## 2013-10-31 HISTORY — PX: PERCUTANEOUS CORONARY STENT INTERVENTION (PCI-S): SHX5485

## 2013-10-31 HISTORY — PX: CORONARY ANGIOPLASTY WITH STENT PLACEMENT: SHX49

## 2013-10-31 LAB — POCT ACTIVATED CLOTTING TIME: Activated Clotting Time: 428 seconds

## 2013-10-31 SURGERY — PERCUTANEOUS CORONARY STENT INTERVENTION (PCI-S)
Anesthesia: LOCAL

## 2013-10-31 MED ORDER — BIVALIRUDIN 250 MG IV SOLR
INTRAVENOUS | Status: AC
  Filled 2013-10-31: qty 250

## 2013-10-31 MED ORDER — SODIUM CHLORIDE 0.9 % IV SOLN: INTRAVENOUS | Status: AC

## 2013-10-31 MED ORDER — MIDAZOLAM HCL 2 MG/2ML IJ SOLN
INTRAMUSCULAR | Status: AC
  Filled 2013-10-31: qty 2

## 2013-10-31 MED ORDER — HEPARIN SODIUM (PORCINE) 1000 UNIT/ML IJ SOLN
INTRAMUSCULAR | Status: AC
  Filled 2013-10-31: qty 1

## 2013-10-31 MED ORDER — LIDOCAINE HCL (PF) 1 % IJ SOLN
INTRAMUSCULAR | Status: AC
  Filled 2013-10-31: qty 30

## 2013-10-31 MED ORDER — FENTANYL CITRATE 0.05 MG/ML IJ SOLN
INTRAMUSCULAR | Status: AC
  Filled 2013-10-31: qty 2

## 2013-10-31 MED ORDER — NITROGLYCERIN 1 MG/10 ML FOR IR/CATH LAB
INTRA_ARTERIAL | Status: AC
  Filled 2013-10-31: qty 10

## 2013-10-31 MED ORDER — SODIUM CHLORIDE 0.9 % IV SOLN
0.2500 mg/kg/h | INTRAVENOUS | Status: AC
  Filled 2013-10-31: qty 250

## 2013-10-31 MED ORDER — VERAPAMIL HCL 2.5 MG/ML IV SOLN
INTRAVENOUS | Status: AC
  Filled 2013-10-31: qty 2

## 2013-10-31 MED ORDER — HEPARIN (PORCINE) IN NACL 2-0.9 UNIT/ML-% IJ SOLN
INTRAMUSCULAR | Status: AC
  Filled 2013-10-31: qty 1000

## 2013-10-31 MED FILL — Sodium Chloride IV Soln 0.9%: INTRAVENOUS | Qty: 50 | Status: AC

## 2013-10-31 NOTE — H&P (View-Only) (Signed)
DAILY PROGRESS NOTE  Subjective:  No events overnight. No further chest pain. Radial cath site looks good. Echo shows EF 45-50% with inferior hypokinesis to akinesis. Plan for staged PCI to LCx today.  Objective:  Temp:  [97.7 F (36.5 C)-99.7 F (37.6 C)] 97.7 F (36.5 C) (08/05 0746) Pulse Rate:  [60-67] 60 (08/05 0746) Resp:  [11-13] 13 (08/04 1100) BP: (94-119)/(47-83) 104/59 mmHg (08/05 0746) SpO2:  [96 %-99 %] 98 % (08/05 0746) Weight change:   Intake/Output from previous day: 08/04 0701 - 08/05 0700 In: 2384.5 [P.O.:960; I.V.:1424.5] Out: -   Intake/Output from this shift:    Medications: Current Facility-Administered Medications  Medication Dose Route Frequency Provider Last Rate Last Dose  . 0.9 %  sodium chloride infusion  250 mL Intravenous PRN Sherren Mocha, MD      . 0.9 %  sodium chloride infusion  250 mL Intravenous PRN Lorretta Harp, MD      . 0.9 %  sodium chloride infusion  1 mL/kg/hr Intravenous Continuous Lorretta Harp, MD 93 mL/hr at 10/30/13 1941 1 mL/kg/hr at 10/30/13 1941  . acetaminophen (TYLENOL) tablet 650 mg  650 mg Oral Q4H PRN Sherren Mocha, MD      . aspirin EC tablet 81 mg  81 mg Oral Daily Grafton Folk, MD      . atorvastatin (LIPITOR) tablet 80 mg  80 mg Oral q1800 Grafton Folk, MD   80 mg at 10/30/13 1758  . enoxaparin (LOVENOX) injection 40 mg  40 mg Subcutaneous Q24H Grafton Folk, MD   40 mg at 10/30/13 1758  . morphine 2 MG/ML injection 2 mg  2 mg Intravenous Q1H PRN Sherren Mocha, MD      . nitroGLYCERIN (NITROSTAT) SL tablet 0.4 mg  0.4 mg Sublingual Q5 Min x 3 PRN Grafton Folk, MD      . ondansetron (ZOFRAN) injection 4 mg  4 mg Intravenous Q6H PRN Grafton Folk, MD      . oxyCODONE-acetaminophen (PERCOCET/ROXICET) 5-325 MG per tablet 1-2 tablet  1-2 tablet Oral Q4H PRN Sherren Mocha, MD      . prasugrel (EFFIENT) tablet 10 mg  10 mg Oral Daily Sherren Mocha, MD   10 mg at 10/30/13 0941  . sodium chloride 0.9 %  injection 3 mL  3 mL Intravenous Q12H Sherren Mocha, MD      . sodium chloride 0.9 % injection 3 mL  3 mL Intravenous PRN Sherren Mocha, MD      . sodium chloride 0.9 % injection 3 mL  3 mL Intravenous Q12H Lorretta Harp, MD      . sodium chloride 0.9 % injection 3 mL  3 mL Intravenous PRN Lorretta Harp, MD        Physical Exam: General appearance: alert and no distress Lungs: clear to auscultation bilaterally Heart: regular rate and rhythm, S1, S2 normal, no murmur, click, rub or gallop Extremities: extremities normal, atraumatic, no cyanosis or edema and radial cath site without ecchymosis or bruit Pulses: 2+ and symmetric  Lab Results: Results for orders placed during the hospital encounter of 10/30/13 (from the past 48 hour(s))  CBC WITH DIFFERENTIAL     Status: None   Collection Time    10/30/13  2:09 AM      Result Value Ref Range   WBC 8.2  4.0 - 10.5 K/uL   RBC 5.22  4.22 - 5.81 MIL/uL   Hemoglobin 15.9  13.0 -  17.0 g/dL   HCT 46.0  39.0 - 52.0 %   MCV 88.1  78.0 - 100.0 fL   MCH 30.5  26.0 - 34.0 pg   MCHC 34.6  30.0 - 36.0 g/dL   RDW 13.5  11.5 - 15.5 %   Platelets 188  150 - 400 K/uL   Neutrophils Relative % 44  43 - 77 %   Neutro Abs 3.6  1.7 - 7.7 K/uL   Lymphocytes Relative 45  12 - 46 %   Lymphs Abs 3.7  0.7 - 4.0 K/uL   Monocytes Relative 8  3 - 12 %   Monocytes Absolute 0.7  0.1 - 1.0 K/uL   Eosinophils Relative 2  0 - 5 %   Eosinophils Absolute 0.2  0.0 - 0.7 K/uL   Basophils Relative 1  0 - 1 %   Basophils Absolute 0.0  0.0 - 0.1 K/uL  BASIC METABOLIC PANEL     Status: Abnormal   Collection Time    10/30/13  2:09 AM      Result Value Ref Range   Sodium 142  137 - 147 mEq/L   Potassium 3.8  3.7 - 5.3 mEq/L   Chloride 102  96 - 112 mEq/L   CO2 25  19 - 32 mEq/L   Glucose, Bld 117 (*) 70 - 99 mg/dL   BUN 22  6 - 23 mg/dL   Creatinine, Ser 1.20  0.50 - 1.35 mg/dL   Calcium 10.1  8.4 - 10.5 mg/dL   GFR calc non Af Amer 68 (*) >90 mL/min   GFR  calc Af Amer 79 (*) >90 mL/min   Comment: (NOTE)     The eGFR has been calculated using the CKD EPI equation.     This calculation has not been validated in all clinical situations.     eGFR's persistently <90 mL/min signify possible Chronic Kidney     Disease.   Anion gap 15  5 - 15  TROPONIN I     Status: None   Collection Time    10/30/13  2:09 AM      Result Value Ref Range   Troponin I <0.30  <0.30 ng/mL   Comment:            Due to the release kinetics of cTnI,     a negative result within the first hours     of the onset of symptoms does not rule out     myocardial infarction with certainty.     If myocardial infarction is still suspected,     repeat the test at appropriate intervals.  PROTIME-INR     Status: None   Collection Time    10/30/13  2:09 AM      Result Value Ref Range   Prothrombin Time 13.4  11.6 - 15.2 seconds   INR 1.02  0.00 - 1.49  POCT ACTIVATED CLOTTING TIME     Status: None   Collection Time    10/30/13  3:26 AM      Result Value Ref Range   Activated Clotting Time 422    MRSA PCR SCREENING     Status: None   Collection Time    10/30/13  4:12 AM      Result Value Ref Range   MRSA by PCR NEGATIVE  NEGATIVE   Comment:            The GeneXpert MRSA Assay (FDA     approved for NASAL  specimens     only), is one component of a     comprehensive MRSA colonization     surveillance program. It is not     intended to diagnose MRSA     infection nor to guide or     monitor treatment for     MRSA infections.  TROPONIN I     Status: Abnormal   Collection Time    10/30/13  8:00 AM      Result Value Ref Range   Troponin I >20.00 (*) <0.30 ng/mL   Comment:            Due to the release kinetics of cTnI,     a negative result within the first hours     of the onset of symptoms does not rule out     myocardial infarction with certainty.     If myocardial infarction is still suspected,     repeat the test at appropriate intervals.     CRITICAL RESULT  CALLED TO, READ BACK BY AND VERIFIED WITH:     Koren Bound RN 10/30/13 0855 COSTELLO B  HEMOGLOBIN A1C     Status: None   Collection Time    10/30/13  8:00 AM      Result Value Ref Range   Hemoglobin A1C 5.5  <5.7 %   Comment: (NOTE)                                                                               According to the ADA Clinical Practice Recommendations for 2011, when     HbA1c is used as a screening test:      >=6.5%   Diagnostic of Diabetes Mellitus               (if abnormal result is confirmed)     5.7-6.4%   Increased risk of developing Diabetes Mellitus     References:Diagnosis and Classification of Diabetes Mellitus,Diabetes     BPZW,2585,27(POEUM 1):S62-S69 and Standards of Medical Care in             Diabetes - 2011,Diabetes Care,2011,34 (Suppl 1):S11-S61.   Mean Plasma Glucose 111  <117 mg/dL   Comment: Performed at [facility]     Status: Abnormal   Collection Time    10/30/13  8:00 AM      Result Value Ref Range   Cholesterol 216 (*) 0 - 200 mg/dL   Triglycerides 159 (*) <150 mg/dL   HDL 38 (*) >39 mg/dL   Total CHOL/HDL Ratio 5.7     VLDL 32  0 - 40 mg/dL   LDL Cholesterol 146 (*) 0 - 99 mg/dL   Comment:            Total Cholesterol/HDL:CHD Risk     Coronary Heart Disease Risk Table                         Men   Women      1/2 Average Risk   3.4   3.3      Average Risk       5.0   4.4  2 X Average Risk   9.6   7.1      3 X Average Risk  23.4   11.0                Use the calculated Patient Ratio     above and the CHD Risk Table     to determine the patient's CHD Risk.                ATP III CLASSIFICATION (LDL):      <100     mg/dL   Optimal      100-129  mg/dL   Near or Above                        Optimal      130-159  mg/dL   Borderline      160-189  mg/dL   High      >190     mg/dL   Very High  CBC     Status: None   Collection Time    10/30/13  8:00 AM      Result Value Ref Range   WBC 7.1  4.0 - 10.5 K/uL   RBC  4.56  4.22 - 5.81 MIL/uL   Hemoglobin 14.0  13.0 - 17.0 g/dL   HCT 41.2  39.0 - 52.0 %   MCV 90.4  78.0 - 100.0 fL   MCH 30.7  26.0 - 34.0 pg   MCHC 34.0  30.0 - 36.0 g/dL   RDW 13.5  11.5 - 15.5 %   Platelets 152  150 - 400 K/uL  BASIC METABOLIC PANEL     Status: None   Collection Time    10/30/13  8:00 AM      Result Value Ref Range   Sodium 138  137 - 147 mEq/L   Potassium 4.2  3.7 - 5.3 mEq/L   Chloride 104  96 - 112 mEq/L   CO2 22  19 - 32 mEq/L   Glucose, Bld 99  70 - 99 mg/dL   BUN 19  6 - 23 mg/dL   Creatinine, Ser 0.95  0.50 - 1.35 mg/dL   Calcium 9.0  8.4 - 10.5 mg/dL   GFR calc non Af Amer >90  >90 mL/min   GFR calc Af Amer >90  >90 mL/min   Comment: (NOTE)     The eGFR has been calculated using the CKD EPI equation.     This calculation has not been validated in all clinical situations.     eGFR's persistently <90 mL/min signify possible Chronic Kidney     Disease.   Anion gap 12  5 - 15  TROPONIN I     Status: Abnormal   Collection Time    10/30/13  1:25 PM      Result Value Ref Range   Troponin I >20.00 (*) <0.30 ng/mL   Comment:            Due to the release kinetics of cTnI,     a negative result within the first hours     of the onset of symptoms does not rule out     myocardial infarction with certainty.     If myocardial infarction is still suspected,     repeat the test at appropriate intervals.     CRITICAL VALUE NOTED.  VALUE IS CONSISTENT WITH PREVIOUSLY REPORTED AND CALLED VALUE.  TROPONIN I     Status: Abnormal  Collection Time    10/30/13  8:00 PM      Result Value Ref Range   Troponin I >20.00 (*) <0.30 ng/mL   Comment:            Due to the release kinetics of cTnI,     a negative result within the first hours     of the onset of symptoms does not rule out     myocardial infarction with certainty.     If myocardial infarction is still suspected,     repeat the test at appropriate intervals.     CRITICAL VALUE NOTED.  VALUE IS  CONSISTENT WITH PREVIOUSLY REPORTED AND CALLED VALUE.    Imaging: Dg Chest Portable 1 View  10/30/2013   CLINICAL DATA:  Chest pain.  Code STEMI.  EXAM: PORTABLE CHEST - 1 VIEW  COMPARISON:  None.  FINDINGS: Normal heart size and mediastinal contours. No acute infiltrate or edema. No effusion or pneumothorax. No acute osseous findings.  IMPRESSION: No active disease.   Electronically Signed   By: Jorje Guild M.D.   On: 10/30/2013 02:25    Assessment:  Active Problems:   ST elevation myocardial infarction (STEMI) of inferior wall   Cardiomyopathy, ischemic   Dyslipidemia   Plan:  1. Jimmy Goodwin is doing better after PCI to the RCA. Troponins were significantly elevated to >20. He has residual LCx disease and inferior hypokinesis to akinesis with EF 45-50% - plan for staged PCI today. He was not on medications prior to admission and had no "diagnosed" medical problems. Cholesterol profile is markedly abnormal and he will need at least a 50% reduction in LDL-C. Agree with lipitor 80 mg daily. He is on aspirin and effient. At this point, BP and HR will not support addition of b-blocker or ACE-I.  Time Spent Directly with Patient:  15 minutes  Length of Stay:  LOS: 1 day   Pixie Casino, MD, [facility] Attending Cardiologist [facility]  HILTY,Kenneth C 10/31/2013, 8:40 AM

## 2013-10-31 NOTE — Progress Notes (Signed)
DAILY PROGRESS NOTE  Subjective:  No events overnight. No further chest pain. Radial cath site looks good. Echo shows EF 45-50% with inferior hypokinesis to akinesis. Plan for staged PCI to LCx today.  Objective:  Temp:  [97.7 F (36.5 C)-99.7 F (37.6 C)] 97.7 F (36.5 C) (08/05 0746) Pulse Rate:  [60-67] 60 (08/05 0746) Resp:  [11-13] 13 (08/04 1100) BP: (94-119)/(47-83) 104/59 mmHg (08/05 0746) SpO2:  [96 %-99 %] 98 % (08/05 0746) Weight change:   Intake/Output from previous day: 08/04 0701 - 08/05 0700 In: 2384.5 [P.O.:960; I.V.:1424.5] Out: -   Intake/Output from this shift:    Medications: Current Facility-Administered Medications  Medication Dose Route Frequency Provider Last Rate Last Dose  . 0.9 %  sodium chloride infusion  250 mL Intravenous PRN Sherren Mocha, MD      . 0.9 %  sodium chloride infusion  250 mL Intravenous PRN Lorretta Harp, MD      . 0.9 %  sodium chloride infusion  1 mL/kg/hr Intravenous Continuous Lorretta Harp, MD 93 mL/hr at 10/30/13 1941 1 mL/kg/hr at 10/30/13 1941  . acetaminophen (TYLENOL) tablet 650 mg  650 mg Oral Q4H PRN Sherren Mocha, MD      . aspirin EC tablet 81 mg  81 mg Oral Daily Grafton Folk, MD      . atorvastatin (LIPITOR) tablet 80 mg  80 mg Oral q1800 Grafton Folk, MD   80 mg at 10/30/13 1758  . enoxaparin (LOVENOX) injection 40 mg  40 mg Subcutaneous Q24H Grafton Folk, MD   40 mg at 10/30/13 1758  . morphine 2 MG/ML injection 2 mg  2 mg Intravenous Q1H PRN Sherren Mocha, MD      . nitroGLYCERIN (NITROSTAT) SL tablet 0.4 mg  0.4 mg Sublingual Q5 Min x 3 PRN Grafton Folk, MD      . ondansetron (ZOFRAN) injection 4 mg  4 mg Intravenous Q6H PRN Grafton Folk, MD      . oxyCODONE-acetaminophen (PERCOCET/ROXICET) 5-325 MG per tablet 1-2 tablet  1-2 tablet Oral Q4H PRN Sherren Mocha, MD      . prasugrel (EFFIENT) tablet 10 mg  10 mg Oral Daily Sherren Mocha, MD   10 mg at 10/30/13 0941  . sodium chloride 0.9 %  injection 3 mL  3 mL Intravenous Q12H Sherren Mocha, MD      . sodium chloride 0.9 % injection 3 mL  3 mL Intravenous PRN Sherren Mocha, MD      . sodium chloride 0.9 % injection 3 mL  3 mL Intravenous Q12H Lorretta Harp, MD      . sodium chloride 0.9 % injection 3 mL  3 mL Intravenous PRN Lorretta Harp, MD        Physical Exam: General appearance: alert and no distress Lungs: clear to auscultation bilaterally Heart: regular rate and rhythm, S1, S2 normal, no murmur, click, rub or gallop Extremities: extremities normal, atraumatic, no cyanosis or edema and radial cath site without ecchymosis or bruit Pulses: 2+ and symmetric  Lab Results: Results for orders placed during the hospital encounter of 10/30/13 (from the past 48 hour(s))  CBC WITH DIFFERENTIAL     Status: None   Collection Time    10/30/13  2:09 AM      Result Value Ref Range   WBC 8.2  4.0 - 10.5 K/uL   RBC 5.22  4.22 - 5.81 MIL/uL   Hemoglobin 15.9  13.0 -  17.0 g/dL   HCT 46.0  39.0 - 52.0 %   MCV 88.1  78.0 - 100.0 fL   MCH 30.5  26.0 - 34.0 pg   MCHC 34.6  30.0 - 36.0 g/dL   RDW 13.5  11.5 - 15.5 %   Platelets 188  150 - 400 K/uL   Neutrophils Relative % 44  43 - 77 %   Neutro Abs 3.6  1.7 - 7.7 K/uL   Lymphocytes Relative 45  12 - 46 %   Lymphs Abs 3.7  0.7 - 4.0 K/uL   Monocytes Relative 8  3 - 12 %   Monocytes Absolute 0.7  0.1 - 1.0 K/uL   Eosinophils Relative 2  0 - 5 %   Eosinophils Absolute 0.2  0.0 - 0.7 K/uL   Basophils Relative 1  0 - 1 %   Basophils Absolute 0.0  0.0 - 0.1 K/uL  BASIC METABOLIC PANEL     Status: Abnormal   Collection Time    10/30/13  2:09 AM      Result Value Ref Range   Sodium 142  137 - 147 mEq/L   Potassium 3.8  3.7 - 5.3 mEq/L   Chloride 102  96 - 112 mEq/L   CO2 25  19 - 32 mEq/L   Glucose, Bld 117 (*) 70 - 99 mg/dL   BUN 22  6 - 23 mg/dL   Creatinine, Ser 1.20  0.50 - 1.35 mg/dL   Calcium 10.1  8.4 - 10.5 mg/dL   GFR calc non Af Amer 68 (*) >90 mL/min   GFR  calc Af Amer 79 (*) >90 mL/min   Comment: (NOTE)     The eGFR has been calculated using the CKD EPI equation.     This calculation has not been validated in all clinical situations.     eGFR's persistently <90 mL/min signify possible Chronic Kidney     Disease.   Anion gap 15  5 - 15  TROPONIN I     Status: None   Collection Time    10/30/13  2:09 AM      Result Value Ref Range   Troponin I <0.30  <0.30 ng/mL   Comment:            Due to the release kinetics of cTnI,     a negative result within the first hours     of the onset of symptoms does not rule out     myocardial infarction with certainty.     If myocardial infarction is still suspected,     repeat the test at appropriate intervals.  PROTIME-INR     Status: None   Collection Time    10/30/13  2:09 AM      Result Value Ref Range   Prothrombin Time 13.4  11.6 - 15.2 seconds   INR 1.02  0.00 - 1.49  POCT ACTIVATED CLOTTING TIME     Status: None   Collection Time    10/30/13  3:26 AM      Result Value Ref Range   Activated Clotting Time 422    MRSA PCR SCREENING     Status: None   Collection Time    10/30/13  4:12 AM      Result Value Ref Range   MRSA by PCR NEGATIVE  NEGATIVE   Comment:            The GeneXpert MRSA Assay (FDA     approved for NASAL  specimens     only), is one component of a     comprehensive MRSA colonization     surveillance program. It is not     intended to diagnose MRSA     infection nor to guide or     monitor treatment for     MRSA infections.  TROPONIN I     Status: Abnormal   Collection Time    10/30/13  8:00 AM      Result Value Ref Range   Troponin I >20.00 (*) <0.30 ng/mL   Comment:            Due to the release kinetics of cTnI,     a negative result within the first hours     of the onset of symptoms does not rule out     myocardial infarction with certainty.     If myocardial infarction is still suspected,     repeat the test at appropriate intervals.     CRITICAL RESULT  CALLED TO, READ BACK BY AND VERIFIED WITH:     Jimmy Bound RN 10/30/13 0855 COSTELLO B  HEMOGLOBIN A1C     Status: None   Collection Time    10/30/13  8:00 AM      Result Value Ref Range   Hemoglobin A1C 5.5  <5.7 %   Comment: (NOTE)                                                                               According to the ADA Clinical Practice Recommendations for 2011, when     HbA1c is used as a screening test:      >=6.5%   Diagnostic of Diabetes Mellitus               (if abnormal result is confirmed)     5.7-6.4%   Increased risk of developing Diabetes Mellitus     References:Diagnosis and Classification of Diabetes Mellitus,Diabetes     BPZW,2585,27(POEUM 1):S62-S69 and Standards of Medical Care in             Diabetes - 2011,Diabetes Care,2011,34 (Suppl 1):S11-S61.   Mean Plasma Glucose 111  <117 mg/dL   Comment: Performed at [facility]     Status: Abnormal   Collection Time    10/30/13  8:00 AM      Result Value Ref Range   Cholesterol 216 (*) 0 - 200 mg/dL   Triglycerides 159 (*) <150 mg/dL   HDL 38 (*) >39 mg/dL   Total CHOL/HDL Ratio 5.7     VLDL 32  0 - 40 mg/dL   LDL Cholesterol 146 (*) 0 - 99 mg/dL   Comment:            Total Cholesterol/HDL:CHD Risk     Coronary Heart Disease Risk Table                         Men   Women      1/2 Average Risk   3.4   3.3      Average Risk       5.0   4.4  2 X Average Risk   9.6   7.1      3 X Average Risk  23.4   11.0                Use the calculated Patient Ratio     above and the CHD Risk Table     to determine the patient's CHD Risk.                ATP III CLASSIFICATION (LDL):      <100     mg/dL   Optimal      100-129  mg/dL   Near or Above                        Optimal      130-159  mg/dL   Borderline      160-189  mg/dL   High      >190     mg/dL   Very High  CBC     Status: None   Collection Time    10/30/13  8:00 AM      Result Value Ref Range   WBC 7.1  4.0 - 10.5 K/uL   RBC  4.56  4.22 - 5.81 MIL/uL   Hemoglobin 14.0  13.0 - 17.0 g/dL   HCT 41.2  39.0 - 52.0 %   MCV 90.4  78.0 - 100.0 fL   MCH 30.7  26.0 - 34.0 pg   MCHC 34.0  30.0 - 36.0 g/dL   RDW 13.5  11.5 - 15.5 %   Platelets 152  150 - 400 K/uL  BASIC METABOLIC PANEL     Status: None   Collection Time    10/30/13  8:00 AM      Result Value Ref Range   Sodium 138  137 - 147 mEq/L   Potassium 4.2  3.7 - 5.3 mEq/L   Chloride 104  96 - 112 mEq/L   CO2 22  19 - 32 mEq/L   Glucose, Bld 99  70 - 99 mg/dL   BUN 19  6 - 23 mg/dL   Creatinine, Ser 0.95  0.50 - 1.35 mg/dL   Calcium 9.0  8.4 - 10.5 mg/dL   GFR calc non Af Amer >90  >90 mL/min   GFR calc Af Amer >90  >90 mL/min   Comment: (NOTE)     The eGFR has been calculated using the CKD EPI equation.     This calculation has not been validated in all clinical situations.     eGFR's persistently <90 mL/min signify possible Chronic Kidney     Disease.   Anion gap 12  5 - 15  TROPONIN I     Status: Abnormal   Collection Time    10/30/13  1:25 PM      Result Value Ref Range   Troponin I >20.00 (*) <0.30 ng/mL   Comment:            Due to the release kinetics of cTnI,     a negative result within the first hours     of the onset of symptoms does not rule out     myocardial infarction with certainty.     If myocardial infarction is still suspected,     repeat the test at appropriate intervals.     CRITICAL VALUE NOTED.  VALUE IS CONSISTENT WITH PREVIOUSLY REPORTED AND CALLED VALUE.  TROPONIN I     Status: Abnormal  Collection Time    10/30/13  8:00 PM      Result Value Ref Range   Troponin I >20.00 (*) <0.30 ng/mL   Comment:            Due to the release kinetics of cTnI,     a negative result within the first hours     of the onset of symptoms does not rule out     myocardial infarction with certainty.     If myocardial infarction is still suspected,     repeat the test at appropriate intervals.     CRITICAL VALUE NOTED.  VALUE IS  CONSISTENT WITH PREVIOUSLY REPORTED AND CALLED VALUE.    Imaging: Dg Chest Portable 1 View  10/30/2013   CLINICAL DATA:  Chest pain.  Code STEMI.  EXAM: PORTABLE CHEST - 1 VIEW  COMPARISON:  None.  FINDINGS: Normal heart size and mediastinal contours. No acute infiltrate or edema. No effusion or pneumothorax. No acute osseous findings.  IMPRESSION: No active disease.   Electronically Signed   By: Jorje Guild M.D.   On: 10/30/2013 02:25    Assessment:  Active Problems:   ST elevation myocardial infarction (STEMI) of inferior wall   Cardiomyopathy, ischemic   Dyslipidemia   Plan:  1. Jimmy Goodwin is doing better after PCI to the RCA. Troponins were significantly elevated to >20. He has residual LCx disease and inferior hypokinesis to akinesis with EF 45-50% - plan for staged PCI today. He was not on medications prior to admission and had no "diagnosed" medical problems. Cholesterol profile is markedly abnormal and he will need at least a 50% reduction in LDL-C. Agree with lipitor 80 mg daily. He is on aspirin and effient. At this point, BP and HR will not support addition of b-blocker or ACE-I.  Time Spent Directly with Patient:  15 minutes  Length of Stay:  LOS: 1 day   Pixie Casino, MD, [facility] Attending Cardiologist [facility]  HILTY,Kenneth C 10/31/2013, 8:40 AM

## 2013-10-31 NOTE — Interval H&P Note (Signed)
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  The various methods of treatment have been discussed with the patient and family. After consideration of risks, benefits and other options for treatment, the patient has consented to  Procedure(s): PERCUTANEOUS CORONARY STENT INTERVENTION (PCI-S) (N/A) as a surgical intervention .  The patient's history has been reviewed, patient examined, no change in status, stable for surgery.  I have reviewed the patient's chart and labs.  Questions were answered to the patient's satisfaction.    Cath Lab Visit (complete for each Cath Lab visit)  Clinical Evaluation Leading to the Procedure:   ACS: No.  Non-ACS:    Anginal Classification: CCS III Pt continues to have hypotension post inferior MI. RCA was stented on 10/30/13 by Dr. Excell Seltzer. The circumflex has a critical proximal stenosis supplying a large area of myocardium. PCI has been planned because of the above.   Anti-ischemic medical therapy: No Therapy  Non-Invasive Test Results: No non-invasive testing performed  Prior CABG: No previous CABG        Francy Mcilvaine

## 2013-10-31 NOTE — Progress Notes (Signed)
[id number] Cardiac Rehab Pt for second PCI today, ambulation held. Started MI and stent education with pt and wife. I gave pt MI and stent booklets. We discussed risk factors, Effient, activity restrictions, heart healthy diet and Outpt. CRP. He voices understanding. Pt agrees to Outpt. CRP in [place], will send referral. We will follow pt tomorrow. Beatrix Fetters, RN 10/31/2013 12:15 PM

## 2013-10-31 NOTE — Interval H&P Note (Deleted)
History and Physical Interval Note:  10/31/2013 1:50 PM  Jimmy Goodwin  has presented today for PCI with the diagnosis of residual severe CAD. The various methods of treatment have been discussed with the patient and family. After consideration of risks, benefits and other options for treatment, the patient has consented to  Procedure(s): PERCUTANEOUS CORONARY STENT INTERVENTION (PCI-S) (N/A) as a surgical intervention .  The patient's history has been reviewed, patient examined, no change in status, stable for surgery.  I have reviewed the patient's chart and labs.  Questions were answered to the patient's satisfaction.    Cath Lab Visit (complete for each Cath Lab visit)  Clinical Evaluation Leading to the Procedure:   ACS: No. Staged PCI of severe proximal Circumflex stenosis  Non-ACS:    Anginal Classification: CCS I  Anti-ischemic medical therapy: No Therapy  Non-Invasive Test Results: No non-invasive testing performed  Prior CABG: No previous CABG    Sharna Gabrys

## 2013-10-31 NOTE — CV Procedure (Signed)
Cardiac Catheterization Operative Report/Percutaneous Coronary Intervention  Maseo Lattin [id number] 8/5/20151:59 PM Pcp Not In System  Procedure Performed:  1.  PTCA/DES x 1 proximal Circumflex  Operator: Verne Carrow, MD  Arterial access site:  Right radial artery.   Indication: 53 yo male admitted 10/30/13 with inferior STEMI and found to have sub-total occlusion of the distal RCA. A drug eluting stent was placed in the distal RCA by Dr. Excell Seltzer. He also was found to have a critical stenosis in the large, proximal Circumflex vessel supplying a large amount of myocardium. He has remained hypotensive following his inferior MI. Plans today for staged PCI of the proximal Circumflex stenosis.                                       Procedure Details: The risks, benefits, complications, treatment options, and expected outcomes were discussed with the patient. The patient and/or family concurred with the proposed plan, giving informed consent. The patient was brought to the cath lab after IV hydration was begun and oral premedication was given. The patient was further sedated with Versed and Fentanyl. The right wrist was assessed with an Allens test which was positive. The right wrist was prepped and draped in a sterile fashion. 1% lidocaine was used for local anesthesia. Using the modified Seldinger access technique, a 5/6 French sheath was placed in the right radial artery. 3 mg Verapamil was given through the sheath. He was given a bolus of Angiomax and  A drip was started. When the ACT was over 200, I engaged the left main with a XB 3.0 guiding catheter. A Cougar IC wire was advanced down the Circumflex but I could not pass it into the severe bend at the stenosis. I passed this wire into the LAD to help support the guiding catheter. I was able to pass a Whisper wire down the Circumflex artery beyond the stenosis. A 2.0 x 12 mm balloon was used to pre-dilate the stenosis. I was unable to  deliver a stent due to the acute takeoff of the Circumflex from the left main. I then passed a second Whisper wire down the Circumflex to use for support. I then pre-dilated the stenosis with a 2.5 x 8 mm balloon. I then carefully positioned and deployed a 3.5 x 12 mm Promus Premier DES in the proximal Circumflex. The stent was post-dilated with a 3.75 x 8 mm [place] balloon x 1. The stenosis was taken from 90% down to 0%. There was a good step-up in the proximal stented segment. There was excellent flow into the distal vessel.   The sheath was removed from the right radial artery and a Terumo hemostasis band was applied at the arteriotomy site on the right wrist. There were no immediate complications. The patient was taken to the recovery area in stable condition.   Hemodynamic Findings: Central aortic pressure: 104/64  Impression: 1. Critical stenosis proximal Circumflex/staged PCI given large amount of myocardium supplied by this vessel, continued hypotension post inferior MI 2. Successful PTCA/DES x 1 proximal Circumflex. Difficult procedure due to acute takeoff of Circumflex from the left main and location of severe stenosis in an acute bend in the vessel.   Recommendations: Continue medical therapy with ASA/Effient/statin. Will hopefully be able to tolerate low dose beta blocker. No beta blocker and Ace-inh have been started due to hypotension.  Complications:  None. The patient tolerated the procedure well.

## 2013-11-01 ENCOUNTER — Encounter (HOSPITAL_COMMUNITY): Payer: Self-pay | Admitting: Physician Assistant

## 2013-11-01 DIAGNOSIS — Z9861 Coronary angioplasty status: Secondary | ICD-10-CM

## 2013-11-01 DIAGNOSIS — I2589 Other forms of chronic ischemic heart disease: Secondary | ICD-10-CM

## 2013-11-01 DIAGNOSIS — I2119 ST elevation (STEMI) myocardial infarction involving other coronary artery of inferior wall: Secondary | ICD-10-CM

## 2013-11-01 LAB — CBC
HCT: 40.1 % (ref 39.0–52.0)
Hemoglobin: 13.3 g/dL (ref 13.0–17.0)
MCH: 30 pg (ref 26.0–34.0)
MCHC: 33.2 g/dL (ref 30.0–36.0)
MCV: 90.3 fL (ref 78.0–100.0)
PLATELETS: 163 10*3/uL (ref 150–400)
RBC: 4.44 MIL/uL (ref 4.22–5.81)
RDW: 13.6 % (ref 11.5–15.5)
WBC: 7.5 10*3/uL (ref 4.0–10.5)

## 2013-11-01 LAB — BASIC METABOLIC PANEL
Anion gap: 11 (ref 5–15)
BUN: 13 mg/dL (ref 6–23)
CALCIUM: 8.4 mg/dL (ref 8.4–10.5)
CO2: 26 mEq/L (ref 19–32)
CREATININE: 1.04 mg/dL (ref 0.50–1.35)
Chloride: 106 mEq/L (ref 96–112)
GFR calc Af Amer: >90 mL/min (ref >90)
GFR, EST NON AFRICAN AMERICAN: 81 mL/min — AB (ref >90)
GLUCOSE: 91 mg/dL (ref 70–99)
Potassium: 4.4 mEq/L (ref 3.7–5.3)
Sodium: 143 mEq/L (ref 137–147)

## 2013-11-01 MED ORDER — PRASUGREL HCL 10 MG PO TABS: 10.0000 mg | ORAL_TABLET | Freq: Every day | ORAL | Status: DC

## 2013-11-01 MED ORDER — ASPIRIN 81 MG PO TBEC: 81.0000 mg | DELAYED_RELEASE_TABLET | Freq: Every day | ORAL | Status: AC

## 2013-11-01 MED ORDER — NITROGLYCERIN 0.4 MG SL SUBL: 0.4000 mg | SUBLINGUAL_TABLET | SUBLINGUAL | Status: DC | PRN

## 2013-11-01 MED ORDER — ATORVASTATIN CALCIUM 80 MG PO TABS: 80.0000 mg | ORAL_TABLET | Freq: Every day | ORAL | Status: DC

## 2013-11-01 MED FILL — Sodium Chloride IV Soln 0.9%: INTRAVENOUS | Qty: 50 | Status: AC

## 2013-11-01 NOTE — Progress Notes (Signed)
I have seen and evaluated the patient is morning along with Huey Bienenstock, PA-C. I agree with his findings, impression and recommendations. Patient stable for discharge post-PTCI.  Discharge today with followup as scheduled.  Marykay Lex, M.D., M.S. Interventional Cardiologist   Pager # [phone] 11/01/2013

## 2013-11-01 NOTE — Discharge Summary (Signed)
I have seen and evaluated the patient is morning along with Huey Bienenstock, PA-C. I agree with his findings, impression and recommendations. Patient stable for discharge post-PTCI.  I agree with the discharge summary  Discharge today with followup as scheduled.  Marykay Lex, M.D., M.S. Interventional Cardiologist   Pager # [phone] 11/01/2013

## 2013-11-01 NOTE — Progress Notes (Signed)
CARDIAC REHAB PHASE I   PRE:  Rate/Rhythm: 72 SR  BP:  Supine:   Sitting: 107/75  Standing:    SaO2:   MODE:  Ambulation: 1000 ft   POST:  Rate/Rhythm: 88 SR  BP:  Supine:   Sitting: 122/72  Standing:    SaO2:  [id number] Pt tolerated ambulation well without c/o of cp or SOB. VS stable Pt to side of bed after walk. Completed MI education with pt and wife. He voices understanding.  Melina Copa RN 11/01/2013 8:55 AM

## 2013-11-01 NOTE — Discharge Instructions (Signed)
1.  No driving for three days and limited driving(short distances) for two weeks after that.    2.  You may return to work on 11/19/13 as long as it is not physically exerting.    3.  You need to rest for the next two weeks.

## 2013-11-01 NOTE — Discharge Summary (Signed)
Physician Discharge Summary     Cardiologist: Excell Seltzerooper  Patient ID: Jimmy Goodwin MRN: [id number] DOB/AGE: 52-Feb-1962 52 y.o.  Admit date: 10/30/2013 Discharge date: 11/01/2013  Admission Diagnoses:   ST elevation myocardial infarction (STEMI) of inferior wall  Discharge Diagnoses:  Active Problems:   ST elevation myocardial infarction (STEMI) of inferior wall   Cardiomyopathy, ischemic   Dyslipidemia     Discharged Condition: stable  Hospital Course:   The patient is a 53 yo male with no pertinent PMH.  He states that he had been experiencing what he thought as GERD symptoms for the past 1 month . These are described as substernal burning/ pressure radiating to neck worse with exertion and relieved partially with antacids. These were intermittent today was much more severe and persistent and he decided to come into the ER. He denies any other cardiac co-morbids, FH , or previous cardiac workup.  In the ER he was given fentanyl, zofran , heparin and aspirin. Currently he rates his pain as 3/10 . EKG shows significant inferior ST Elevations.  ROS ; No orthopnea, PND , LE edema , DOE, focal weakness, syncope, bleeding diathesis , claudication , palpitation etc.  Reports medication compliance  The patient was taken for emergent LHC which revealed subtotal occlusion of the distal RCA, treated successfully with primary PCI using a DES platform. Severe residual LCx stenosis.  Nonobstructive LAD stenosis. Mild segmental LV systolic dysfunction.  He was started on Effient and then underwent stage PCI for critical stenosis of proximal Circumflex.   Difficult procedure due to acute takeoff of Circumflex from the left main and location of severe stenosis in an acute bend in the vessel.  He had continued hypotension post inferior MI.  He was also placed on a statin. BP still on the low side which gave no room for ACE/BB.  He ambulated with Cardiac rehab.  Echocardiogram revealed an EF of 45%, akinesis on the  entire inferior wall and severe hypokinesis of the basal and mid inferolateral walls consistent with infarct or stunning in the RCA territory. The patient was seen by Dr. Herbie BaltimoreHarding who felt he was stable for DC home.   I made it clear he is not to return to work until 11/19/13.  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    Consults: Cardiac rehab  Significant Diagnostic Studies:   Cardiac Catheterization Procedure Note  Name: Jimmy ResidesRonald Koegel  MRN: [id number]  DOB: 12-May-1960  Procedure: Left Heart Cath, Selective Coronary Angiography, LV angiography, PTCA and stenting of the RCA  Indication: Inferior STEMI  Procedural Details: The right wrist was prepped, draped, and anesthetized with 1% lidocaine. Using the modified Seldinger technique, a 5/6 French Slender sheath was introduced into the right radial artery. 3 mg of verapamil was administered through the sheath, weight-based unfractionated heparin was administered intravenously. Standard Judkins catheters were used for selective coronary angiography and left ventriculography. Ventriculography was performed after PCI. Catheter exchanges were performed over an exchange length guidewire.  PROCEDURAL FINDINGS  Hemodynamics:  AO 87/55  LV 86/22  Coronary angiography:  Coronary dominance: right  Left mainstem: Widely patent, no obstructive disease  Left anterior descending (LAD): Mild proximal stenosis diffuse 30%, otherwise widely patent without significant obstruction  Left circumflex (LCx): 90% proximal LCx at 90 degree bend, otherwise widely patent. Single large OM is present.  Right coronary artery (RCA): mild-moderate calcification, 99% stenosis in the distal RCA. PDA and PLA branches are patent. TIMI-2 flow initially. PDA and PLA branches are patent.  Left ventriculography: Basal and mid-inferior akinesis, LVEF 40-45%.  PCI Note: Following the diagnostic procedure, the decision was made to proceed with PCI of the distal RCA. Weight-based  bivalirudin was given for anticoagulation. Effient 60 mg PO was administered. Once a therapeutic ACT was achieved, a 6 [place] JR4 guide catheter was inserted. A cougar coronary guidewire was used to cross the lesion. The lesion was predilated with a 2.5x15 mm balloon. The lesion was then stented with a 3.5x20 mm Promus DES. The stent was not post-dilated because of a step-up and step-down was present off the proximal and distal ends of the stent. Following PCI, there was 0% residual stenosis and TIMI-3 flow. Final angiography confirmed an excellent result. The patient tolerated the procedure well. There were no immediate procedural complications. A TR band was used for radial hemostasis. The patient was transferred to the post catheterization recovery area for further monitoring.  PCI Data:  Vessel - RCA/Segment - distal  Percent Stenosis (pre) 99  TIMI-flow 2  Stent 3.5x20 mm Promus DES  Percent Stenosis (post) 0  TIMI-flow (post) 3  Contrast: 125 cc Omnipaque  Fluoro time: 9.2 min  Estimated Blood Loss: minimal   Final Conclusions:  1. Acute inferior wall MI secondary to subtotal occlusion of the distal RCA, treated successfully with primary PCI using a DES platform  2. Severe residual LCx stenosis  3. Nonobstructive LAD stenosis  4. Mild segmental LV systolic dysfunction  Recommendations:  Staged PCI of the LCx Wednesday if stable, anticipate d/c home Thursday. With large area of myocardium and severe stenosis of the proximal LCx, favor PCI rather than functional study or med Rx. ASA/Effient x 12 months. Aggressive risk reduction efforts.  [name] MD, [facility]  10/30/2013, 3:54 AM    Cardiac Catheterization Operative Report/Percutaneous Coronary Intervention  Saturnino Liew  [id number]  8/5/20151:59 PM  Pcp Not In System  Procedure Performed:  1. PTCA/DES x 1 proximal Circumflex  Operator: Verne Carrow, MD  Arterial access site: Right radial artery.  Indication: 53 yo male  admitted 10/30/13 with inferior STEMI and found to have sub-total occlusion of the distal RCA. A drug eluting stent was placed in the distal RCA by Dr. Excell Seltzer. He also was found to have a critical stenosis in the large, proximal Circumflex vessel supplying a large amount of myocardium. He has remained hypotensive following his inferior MI. Plans today for staged PCI of the proximal Circumflex stenosis.  Procedure Details:  The risks, benefits, complications, treatment options, and expected outcomes were discussed with the patient. The patient and/or family concurred with the proposed plan, giving informed consent. The patient was brought to the cath lab after IV hydration was begun and oral premedication was given. The patient was further sedated with Versed and Fentanyl. The right wrist was assessed with an Allens test which was positive. The right wrist was prepped and draped in a sterile fashion. 1% lidocaine was used for local anesthesia. Using the modified Seldinger access technique, a 5/6 French sheath was placed in the right radial artery. 3 mg Verapamil was given through the sheath. He was given a bolus of Angiomax and A drip was started. When the ACT was over 200, I engaged the left main with a XB 3.0 guiding catheter. A Cougar IC wire was advanced down the Circumflex but I could not pass it into the severe bend at the stenosis. I passed this wire into the LAD to help support the guiding catheter. I was able to pass a Whisper  wire down the Circumflex artery beyond the stenosis. A 2.0 x 12 mm balloon was used to pre-dilate the stenosis. I was unable to deliver a stent due to the acute takeoff of the Circumflex from the left main. I then passed a second Whisper wire down the Circumflex to use for support. I then pre-dilated the stenosis with a 2.5 x 8 mm balloon. I then carefully positioned and deployed a 3.5 x 12 mm Promus Premier DES in the proximal Circumflex. The stent was post-dilated with a 3.75 x 8 mm  [place] balloon x 1. The stenosis was taken from 90% down to 0%. There was a good step-up in the proximal stented segment. There was excellent flow into the distal vessel.  The sheath was removed from the right radial artery and a Terumo hemostasis band was applied at the arteriotomy site on the right wrist. There were no immediate complications. The patient was taken to the recovery area in stable condition.  Hemodynamic Findings:  Central aortic pressure: 104/64  Impression:  1. Critical stenosis proximal Circumflex/staged PCI given large amount of myocardium supplied by this vessel, continued hypotension post inferior MI  2. Successful PTCA/DES x 1 proximal Circumflex. Difficult procedure due to acute takeoff of Circumflex from the left main and location of severe stenosis in an acute bend in the vessel.  Recommendations: Continue medical therapy with ASA/Effient/statin. Will hopefully be able to tolerate low dose beta blocker. No beta blocker and Ace-inh have been started due to hypotension.  Complications: None. The patient tolerated the procedure well.    Echocardiogram Study Conclusions  - Left ventricle: There is akinesis on the entire inferior wall and severe hypokinesis of the basal and mid inferolateral walls. The cavity size was normal. Systolic function was mildly reduced. The estimated ejection fraction was 45 %.  The transmitral flow pattern was normal. Left ventricular diastolic function parameters were normal. - Aortic valve: Trileaflet; mildly thickened, mildly calcified leaflets. There was mild regurgitation. - Aortic root: The aortic root was normal in size. - Mitral valve: There was no regurgitation. - Left atrium: The atrium was at the upper limits of normal in size. - Right ventricle: Systolic function was normal. - Right atrium: The atrium was normal in size. - Pulmonary arteries: Systolic pressure was within the normal range. - Pericardium, extracardiac: There is a  thick layer of epicardial fat. There was no pericardial effusion.  Impressions:  - There is akinesis on the entire inferior wall and severe hypokinesis of the basal and mid inferolateral walls consistent with infarct of stunning in the RCA territory. The rest of the study is normal.    Treatments: See above  Discharge Exam: Blood pressure 114/61, pulse 64, temperature 97.9 F (36.6 C), temperature source Oral, resp. rate 20, height 5\' 9"  (1.753 m), weight 206 lb 5.6 oz (93.6 kg), SpO2 97.00%.   Disposition: Final discharge disposition not confirmed      Discharge Instructions   Amb Referral to Cardiac Rehabilitation    Complete by:  As directed      Diet - low sodium heart healthy    Complete by:  As directed      Discharge instructions    Complete by:  As directed   No lifting with right arm for three days     Increase activity slowly    Complete by:  As directed             Medication List  aspirin 81 MG EC tablet  Take 1 tablet (81 mg total) by mouth daily.     atorvastatin 80 MG tablet  Commonly known as:  LIPITOR  Take 1 tablet (80 mg total) by mouth daily at 6 PM.     nitroGLYCERIN 0.4 MG SL tablet  Commonly known as:  NITROSTAT  Place 1 tablet (0.4 mg total) under the tongue every 5 (five) minutes x 3 doses as needed for chest pain.     prasugrel 10 MG Tabs tablet  Commonly known as:  EFFIENT  Take 1 tablet (10 mg total) by mouth daily.       Follow-up Information   Follow up with Tereso Newcomer, PA-C On 11/27/2013. (2:00 PM)    Specialty:  Physician Assistant   Contact information:   [address] [place] [address] [phone]      Greater than 30 minutes was spent completing the patient's discharge.   SignedWilburt Finlay, PA-C 11/01/2013, 8:29 AM

## 2013-11-01 NOTE — Progress Notes (Signed)
Subjective: No dizziness or CP  Objective: Vital signs in last 24 hours: Temp:  [97.7 F (36.5 C)-98.7 F (37.1 C)] 98.5 F (36.9 C) (08/06 0025) Pulse Rate:  [46-66] 62 (08/06 0025) Resp:  [18-20] 18 (08/06 0025) BP: (100-121)/(59-69) 100/59 mmHg (08/06 0025) SpO2:  [96 %-99 %] 96 % (08/06 0025) Weight:  [205 lb (92.987 kg)-206 lb 5.6 oz (93.6 kg)] 206 lb 5.6 oz (93.6 kg) (08/06 0025) Last BM Date: 10/29/13  Intake/Output from previous day: 08/05 0701 - 08/06 0700 In: 1584 [P.O.:720; I.V.:864] Out: 1200 [Urine:1200] Intake/Output this shift: Total I/O In: 540 [P.O.:240; I.V.:300] Out: -   Medications Current Facility-Administered Medications  Medication Dose Route Frequency Provider Last Rate Last Dose  . 0.9 %  sodium chloride infusion  250 mL Intravenous PRN [name], MD      . acetaminophen (TYLENOL) tablet 650 mg  650 mg Oral Q4H PRN [name], MD      . aspirin EC tablet 81 mg  81 mg Oral Daily Crissie Sickles, MD      . atorvastatin (LIPITOR) tablet 80 mg  80 mg Oral q1800 Crissie Sickles, MD   80 mg at 10/31/13 1830  . morphine 2 MG/ML injection 2 mg  2 mg Intravenous Q1H PRN [name], MD      . nitroGLYCERIN (NITROSTAT) SL tablet 0.4 mg  0.4 mg Sublingual Q5 Min x 3 PRN Crissie Sickles, MD      . ondansetron (ZOFRAN) injection 4 mg  4 mg Intravenous Q6H PRN Crissie Sickles, MD      . oxyCODONE-acetaminophen (PERCOCET/ROXICET) 5-325 MG per tablet 1-2 tablet  1-2 tablet Oral Q4H PRN [name], MD      . prasugrel (EFFIENT) tablet 10 mg  10 mg Oral Daily [name], MD   10 mg at 10/30/13 0941  . sodium chloride 0.9 % injection 3 mL  3 mL Intravenous Q12H [name], MD   3 mL at 10/31/13 2217  . sodium chloride 0.9 % injection 3 mL  3 mL Intravenous PRN [name], MD       Facility-Administered Medications Ordered in Other Encounters  Medication Dose Route Frequency Provider Last Rate Last Dose  . bivalirudin (ANGIOMAX) 250 mg in  sodium chloride 0.9 % 50 mL (5 mg/mL) infusion  0.25 mg/kg/hr Intravenous To Cath [name], MD        PE: General appearance: alert, cooperative and no distress Lungs: clear to auscultation bilaterally Heart: regular rate and rhythm, S1, S2 normal, no murmur, click, rub or gallop Extremities: No LEE Pulses: 2+ and symmetric Skin: Warm and dry.  No ecchymosis at radial cath site Neurologic: Grossly normal  Lab Results:   Recent Labs  10/30/13 0209 10/30/13 0800 11/01/13 0406  WBC 8.2 7.1 7.5  HGB 15.9 14.0 13.3  HCT 46.0 41.2 40.1  PLT 188 152 163   BMET  Recent Labs  10/30/13 0209 10/30/13 0800 11/01/13 0406  NA 142 138 143  K 3.8 4.2 4.4  CL 102 104 106  CO2 25 22 26   GLUCOSE 117* 99 91  BUN 22 19 13   CREATININE 1.20 0.95 1.04  CALCIUM 10.1 9.0 8.4   PT/INR  Recent Labs  10/30/13 0209  LABPROT 13.4  INR 1.02   Cholesterol  Recent Labs  10/30/13 0800  CHOL 216*   Lipid Panel     Component Value Date/Time   CHOL 216* 10/30/2013 0800   TRIG 159* 10/30/2013  0800   HDL 38* 10/30/2013 0800   CHOLHDL 5.7 10/30/2013 0800   VLDL 32 10/30/2013 0800   LDLCALC 146* 10/30/2013 0800       Assessment/Plan   Active Problems:   ST elevation myocardial infarction (STEMI) of inferior wall   Cardiomyopathy, ischemic   Dyslipidemia   STEMI (ST elevation myocardial infarction)  Plan:   Acute Stemi on 8/4 with PCI to the distal RCA.  Staged PCI yesterday for critical stenosis of proximal Circumflex.  Continued hypotension post inferior MI.  Successful PTCA/DES x 1 proximal Circumflex. Difficult procedure due to acute takeoff of Circumflex from the left main and location of severe stenosis in an acute bend in the vessel.   ASA, Effient, Statin.  BP still on the low side.  No room for ACE/BB.   Ambulate this morning and likely DC home today.  Labs are stable.   LOS: 2 days    Asuka Dusseau PA-C 11/01/2013 6:19 AM

## 2013-11-01 NOTE — Care Management Note (Addendum)
  Page 1 of 1   11/01/2013     9:56:45 AM CARE MANAGEMENT NOTE 11/01/2013  Patient:  Jimmy Goodwin, Jimmy Goodwin   Account Number:  [id number]  Date Initiated:  11/01/2013  Documentation initiated by:  Donato Schultz  Subjective/Objective Assessment:   Acid reflux, blockage     Action/Plan:   CM to follow for disposition needs   Anticipated DC Date:  11/01/2013   Anticipated DC Plan:  HOME/SELF CARE      DC Planning Services  CM consult  Medication Assistance      [facility] Choice  NA   Choice offered to / List presented to:             Status of service:  In process, will continue to follow Medicare Important Message given?   (If response is "NO", the following Medicare IM given date fields will be blank) Date Medicare IM given:   Medicare IM given by:   Date Additional Medicare IM given:   Additional Medicare IM given by:    Discharge Disposition:  HOME/SELF CARE  Per UR Regulation:  Reviewed for med. necessity/level of care/duration of stay  If discussed at Long Length of Stay Meetings, dates discussed:    Comments:  ---11/01/2013 0952 by Donato Schultz--- Benefits check:  For d/c today prasugrel 10 MG Tabs tablet Commonly known as:  EFFIENT Take 1 tablet (10 mg total) by mouth daily. Coverage, co-pay, authorization, deductibles, preferred pharmacy Thanks, Zeferino Mounts RN, BSN, MSHL, CCM  Nurse - Case Manager, (Unit [phone]  11/01/2013

## 2013-11-02 ENCOUNTER — Telehealth: Payer: Self-pay | Admitting: Cardiovascular Disease

## 2013-11-02 NOTE — Telephone Encounter (Signed)
New message      Pt had 2 stents put in recently.  He is c/o diarrhea and pain on rt side at rib cage/hip pain.  No sob, headache, chest pain or dizziness.  Could this be from his medications or the stent?  Wife said Dr Excell Seltzer put his stents in and he was his doctor.

## 2013-11-02 NOTE — Telephone Encounter (Signed)
Per Dr Excell Seltzer he would like the pt to stay on medications at this time.  The atorvastatin could be causing symptoms but Dr Excell Seltzer recommends that the pt wait a week to see if symptoms improve.  If not then the pt should contact our office for further instructions. I spoke with the pt's wife and made her aware of Dr Earmon Phoenix recommendations.

## 2013-11-04 ENCOUNTER — Encounter (HOSPITAL_BASED_OUTPATIENT_CLINIC_OR_DEPARTMENT_OTHER): Payer: Self-pay | Admitting: Emergency Medicine

## 2013-11-04 ENCOUNTER — Emergency Department (HOSPITAL_BASED_OUTPATIENT_CLINIC_OR_DEPARTMENT_OTHER)
Admission: EM | Admit: 2013-11-04 | Discharge: 2013-11-05 | Disposition: A | Discharge status: Home / Self Care | Payer: BC Managed Care – PPO | Attending: Emergency Medicine | Admitting: Emergency Medicine

## 2013-11-04 ENCOUNTER — Other Ambulatory Visit: Payer: Self-pay

## 2013-11-04 DIAGNOSIS — E785 Hyperlipidemia, unspecified: Secondary | ICD-10-CM | POA: Insufficient documentation

## 2013-11-04 DIAGNOSIS — Z9861 Coronary angioplasty status: Secondary | ICD-10-CM | POA: Insufficient documentation

## 2013-11-04 DIAGNOSIS — Z7982 Long term (current) use of aspirin: Secondary | ICD-10-CM | POA: Insufficient documentation

## 2013-11-04 DIAGNOSIS — Z79899 Other long term (current) drug therapy: Secondary | ICD-10-CM | POA: Insufficient documentation

## 2013-11-04 DIAGNOSIS — T50905A Adverse effect of unspecified drugs, medicaments and biological substances, initial encounter: Secondary | ICD-10-CM

## 2013-11-04 DIAGNOSIS — I2510 Atherosclerotic heart disease of native coronary artery without angina pectoris: Secondary | ICD-10-CM | POA: Insufficient documentation

## 2013-11-04 DIAGNOSIS — T466X5A Adverse effect of antihyperlipidemic and antiarteriosclerotic drugs, initial encounter: Secondary | ICD-10-CM | POA: Insufficient documentation

## 2013-11-04 DIAGNOSIS — R197 Diarrhea, unspecified: Secondary | ICD-10-CM | POA: Insufficient documentation

## 2013-11-04 NOTE — ED Notes (Signed)
Pt had stents placed last week for STEMI, was started on lipitor post-cath care. Pt states he took med tonight around 9pm, then started feeling "weird" one hour after meds. Pt describes left sided shoulder pain, tingling of arms and legs, denies nausea or SOB.

## 2013-11-04 NOTE — ED Provider Notes (Signed)
CSN: [id number]     Arrival date & time 11/04/13  2347 History  This chart was scribed for Hanley SeamenJohn L Jernee Murtaugh, MD by Luisa DagoPriscilla Tutu, ED Scribe. This patient was seen in room MH07/MH07 and the patient's care was started at 12:19 AM.    Chief Complaint  Patient presents with  . Medication Reaction    The history is provided by the patient. No language interpreter was used.   HPI Comments: Jimmy Goodwin is a 53 y.o. male who had STEMI on 10/30/13 and was stented and D/C'd home on Asprin, Plavix and Lipitor. He reports profuse diarrhea following his discharge on 11/01/13, which he attributed to the prescribed Lipitor. Pt states that the diarrhea lasted approximately 2 days and resolved on its own on yesterday; he did not take Lipitor yesterday and has not had diarrhea today. He presents to Emergency Department complaining of "feeling weird" about an hour after taking Lipitor this evening. He is having difficulty characterizing this sensation, but it lasted for approximately 45 minutes. He denies lightheadedness, chest pain, shortness of breath, diaphoresis, nausea, vomiting or weakness. Since his heart attack he has had occasional pains in his left shoulder that last for only "split second".   Past Medical History  Diagnosis Date  . CAD (coronary artery disease), native coronary artery 10/30/2013  . Ischemic cardiomyopathy 10/30/2013    EF 45% akinesis on the entire inferior wall and severe hypokinesis of the basal and mid inferolateral walls   . Dyslipidemia 10/30/2013   Past Surgical History  Procedure Laterality Date  . Tonsillectomy    . Stemi  10/30/2013    PCI distal RCA with 3.5x20 mm Promus DES  . Coronary angioplasty with stent placement  10/31/2013    Staged PCI proximal Circumflex with 3.5 x 12 mm Promus DES   History reviewed. No pertinent family history. History  Substance Use Topics  . Smoking status: Never Smoker   . Smokeless tobacco: Not on file  . Alcohol Use: Yes    Review of Systems A  complete 10 system review of systems was obtained and all systems are negative except as noted in the HPI and PMH.   Allergies  Review of patient's allergies indicates no known allergies.  Home Medications   Prior to Admission medications   Medication Sig Start Date End Date Taking? Authorizing Provider  aspirin EC 81 MG EC tablet Take 1 tablet (81 mg total) by mouth daily. 11/01/13   Wilburt FinlayBryan Hager, PA-C  atorvastatin (LIPITOR) 80 MG tablet Take 1 tablet (80 mg total) by mouth daily at 6 PM. 11/01/13   Wilburt FinlayBryan Hager, PA-C  nitroGLYCERIN (NITROSTAT) 0.4 MG SL tablet Place 1 tablet (0.4 mg total) under the tongue every 5 (five) minutes x 3 doses as needed for chest pain. 11/01/13   Wilburt FinlayBryan Hager, PA-C  prasugrel (EFFIENT) 10 MG TABS tablet Take 1 tablet (10 mg total) by mouth daily. 11/01/13   Wilburt FinlayBryan Hager, PA-C   BP 160/89  Pulse 71  Temp(Src) 97.8 F (36.6 C) (Oral)  Resp 16  SpO2 100%  Physical Exam  Nursing note and vitals reviewed. General: Well-developed, well-nourished male in no acute distress; appearance consistent with age of record HENT: normocephalic; atraumatic Eyes: pupils equal, round and reactive to light; extraocular muscles intact Neck: supple Heart: regular rate and rhythm; no murmurs, rubs or gallops Lungs: clear to auscultation bilaterally Abdomen: soft; nondistended; nontender; no masses or hepatosplenomegaly; bowel sounds present Extremities: No deformity; full range of motion; pulses normal Neurologic: Awake,  alert and oriented; motor function intact in all extremities and symmetric; no facial droop Skin: Warm and dry; some resolving ecchymosis to the volar aspect of the right wrist around the site of cardiac catherization.  Psychiatric: Normal mood and affect  ED Course  Procedures (including critical care time)  DIAGNOSTIC STUDIES: Oxygen Saturation is 100% on RA, normal by my interpretation.    COORDINATION OF CARE: 12:34 AM- Pt advised of plan for treatment and pt  agrees.  MDM  EKG Interpretation:  Date & Time: 11/04/2013 23:55 AM  Rate: 74  Rhythm: normal sinus rhythm  QRS Axis: normal  Intervals: normal  ST/T Wave abnormalities: normal  Conduction Disutrbances:none  Narrative Interpretation:   Old EKG Reviewed: Ischemic changes have resolved  Nursing notes and vitals signs, including pulse oximetry, reviewed.  Summary of this visit's results, reviewed by myself:  Labs:  Results for orders placed during the hospital encounter of 11/04/13 (from the past 24 hour(s))  URINALYSIS, ROUTINE W REFLEX MICROSCOPIC     Status: None   Collection Time    11/05/13 12:35 AM      Result Value Ref Range   Color, Urine YELLOW  YELLOW   APPearance CLEAR  CLEAR   Specific Gravity, Urine 1.008  1.005 - 1.030   pH 5.5  5.0 - 8.0   Glucose, UA NEGATIVE  NEGATIVE mg/dL   Hgb urine dipstick NEGATIVE  NEGATIVE   Bilirubin Urine NEGATIVE  NEGATIVE   Ketones, ur NEGATIVE  NEGATIVE mg/dL   Protein, ur NEGATIVE  NEGATIVE mg/dL   Urobilinogen, UA 0.2  0.0 - 1.0 mg/dL   Nitrite NEGATIVE  NEGATIVE   Leukocytes, UA NEGATIVE  NEGATIVE  BASIC METABOLIC PANEL     Status: Abnormal   Collection Time    11/05/13 12:45 AM      Result Value Ref Range   Sodium 140  137 - 147 mEq/L   Potassium 3.6 (*) 3.7 - 5.3 mEq/L   Chloride 103  96 - 112 mEq/L   CO2 20  19 - 32 mEq/L   Glucose, Bld 130 (*) 70 - 99 mg/dL   BUN 21  6 - 23 mg/dL   Creatinine, Ser 8.83  0.50 - 1.35 mg/dL   Calcium 9.3  8.4 - 25.4 mg/dL   GFR calc non Af Amer 85 (*) >90 mL/min   GFR calc Af Amer >90  >90 mL/min   Anion gap 17 (*) 5 - 15  CBC WITH DIFFERENTIAL     Status: None   Collection Time    11/05/13 12:45 AM      Result Value Ref Range   WBC 6.4  4.0 - 10.5 K/uL   RBC 4.77  4.22 - 5.81 MIL/uL   Hemoglobin 14.8  13.0 - 17.0 g/dL   HCT 98.2  64.1 - 58.3 %   MCV 89.5  78.0 - 100.0 fL   MCH 31.0  26.0 - 34.0 pg   MCHC 34.7  30.0 - 36.0 g/dL   RDW 09.4  07.6 - 80.8 %   Platelets 186  150  - 400 K/uL   Neutrophils Relative % 62  43 - 77 %   Neutro Abs 4.0  1.7 - 7.7 K/uL   Lymphocytes Relative 27  12 - 46 %   Lymphs Abs 1.7  0.7 - 4.0 K/uL   Monocytes Relative 8  3 - 12 %   Monocytes Absolute 0.5  0.1 - 1.0 K/uL   Eosinophils Relative 2  0 - 5 %   Eosinophils Absolute 0.1  0.0 - 0.7 K/uL   Basophils Relative 1  0 - 1 %   Basophils Absolute 0.0  0.0 - 0.1 K/uL  HEPATIC FUNCTION PANEL     Status: Abnormal   Collection Time    11/05/13 12:45 AM      Result Value Ref Range   Total Protein 7.3  6.0 - 8.3 g/dL   Albumin 3.4 (*) 3.5 - 5.2 g/dL   AST 22  0 - 37 U/L   ALT 27  0 - 53 U/L   Alkaline Phosphatase 72  39 - 117 U/L   Total Bilirubin 0.4  0.3 - 1.2 mg/dL   Bilirubin, Direct <1.6  0.0 - 0.3 mg/dL   Indirect Bilirubin NOT CALCULATED  0.3 - 0.9 mg/dL   1:09 AM The patient has been asymptomatic in the ED. His EKG shows no evidence of ischemia. The patient likely had an adverse reaction to Lipitor and will discuss this with Dr. Excell Seltzer, his cardiologist, later today.  I personally performed the services described in this documentation, which was scribed in my presence. The recorded information has been reviewed and is accurate.    Hanley Seamen, MD 11/05/13 [phone]

## 2013-11-05 ENCOUNTER — Telehealth: Payer: Self-pay | Admitting: Cardiovascular Disease

## 2013-11-05 LAB — CBC WITH DIFFERENTIAL/PLATELET
Basophils Absolute: 0 10*3/uL (ref 0.0–0.1)
Basophils Relative: 1 % (ref 0–1)
EOS PCT: 2 % (ref 0–5)
Eosinophils Absolute: 0.1 10*3/uL (ref 0.0–0.7)
HEMATOCRIT: 42.7 % (ref 39.0–52.0)
Hemoglobin: 14.8 g/dL (ref 13.0–17.0)
LYMPHS ABS: 1.7 10*3/uL (ref 0.7–4.0)
Lymphocytes Relative: 27 % (ref 12–46)
MCH: 31 pg (ref 26.0–34.0)
MCHC: 34.7 g/dL (ref 30.0–36.0)
MCV: 89.5 fL (ref 78.0–100.0)
MONOS PCT: 8 % (ref 3–12)
Monocytes Absolute: 0.5 10*3/uL (ref 0.1–1.0)
Neutro Abs: 4 10*3/uL (ref 1.7–7.7)
Neutrophils Relative %: 62 % (ref 43–77)
PLATELETS: 186 10*3/uL (ref 150–400)
RBC: 4.77 MIL/uL (ref 4.22–5.81)
RDW: 13 % (ref 11.5–15.5)
WBC: 6.4 10*3/uL (ref 4.0–10.5)

## 2013-11-05 LAB — URINALYSIS, ROUTINE W REFLEX MICROSCOPIC
Bilirubin Urine: NEGATIVE
Glucose, UA: NEGATIVE mg/dL
HGB URINE DIPSTICK: NEGATIVE
Ketones, ur: NEGATIVE mg/dL
Leukocytes, UA: NEGATIVE
Nitrite: NEGATIVE
PROTEIN: NEGATIVE mg/dL
SPECIFIC GRAVITY, URINE: 1.008 (ref 1.005–1.030)
UROBILINOGEN UA: 0.2 mg/dL (ref 0.0–1.0)
pH: 5.5 (ref 5.0–8.0)

## 2013-11-05 LAB — HEPATIC FUNCTION PANEL
ALK PHOS: 72 U/L (ref 39–117)
ALT: 27 U/L (ref 0–53)
AST: 22 U/L (ref 0–37)
Albumin: 3.4 g/dL — ABNORMAL LOW (ref 3.5–5.2)
BILIRUBIN TOTAL: 0.4 mg/dL (ref 0.3–1.2)
Bilirubin, Direct: <0.2 mg/dL (ref 0.0–0.3)
Total Protein: 7.3 g/dL (ref 6.0–8.3)

## 2013-11-05 LAB — BASIC METABOLIC PANEL
Anion gap: 17 — ABNORMAL HIGH (ref 5–15)
BUN: 21 mg/dL (ref 6–23)
CO2: 20 mEq/L (ref 19–32)
Calcium: 9.3 mg/dL (ref 8.4–10.5)
Chloride: 103 mEq/L (ref 96–112)
Creatinine, Ser: 1 mg/dL (ref 0.50–1.35)
GFR calc Af Amer: >90 mL/min (ref >90)
GFR, EST NON AFRICAN AMERICAN: 85 mL/min — AB (ref >90)
GLUCOSE: 130 mg/dL — AB (ref 70–99)
POTASSIUM: 3.6 meq/L — AB (ref 3.7–5.3)
Sodium: 140 mEq/L (ref 137–147)

## 2013-11-05 NOTE — Telephone Encounter (Signed)
New message   Wife calling     Patient had MI last Tuesday night.   Discharge home on Thursday .   Lipitor was not  taken on Saturday night. Restarted on Sunday night within a hour had lower extremities issues. Went to er at [facility].   MD think he was having a reaction to Lipitor due to symptoms.   Wants to know if he should continue meds or not / suggestion, options.

## 2013-11-05 NOTE — Telephone Encounter (Signed)
Discussed with Bing Neighbors PA and will have patient continue off Lipitor for few days and then start Crestor 20 mg daily. Advised wife. No samples today with check Wednesday when back in office

## 2013-11-05 NOTE — Telephone Encounter (Signed)
Patient d/c from hospital on Lipitor 80 mg daily. Started and began to have diarrhea, continued to take for another day before holding. He held for one night and then started to have weakness in hands and feet, went to ED and was told side effect of Lipitor. Will forward to Dawayne Patricia NP for review

## 2013-11-08 MED ORDER — ROSUVASTATIN CALCIUM 20 MG PO TABS: 20.0000 mg | ORAL_TABLET | Freq: Every day | ORAL | Status: DC

## 2013-11-08 NOTE — Telephone Encounter (Signed)
No samples yesterday or today. Did find 30 day trial free coupon. Left message for wife to call back

## 2013-11-09 NOTE — Telephone Encounter (Signed)
Advised wife  

## 2013-11-22 ENCOUNTER — Encounter (HOSPITAL_COMMUNITY)
Admission: RE | Admit: 2013-11-22 | Discharge: 2013-11-22 | Disposition: A | Discharge status: Home / Self Care | Payer: BC Managed Care – PPO | Source: Ambulatory Visit | Attending: Cardiovascular Disease | Admitting: Cardiovascular Disease

## 2013-11-22 NOTE — Progress Notes (Signed)
Cardiac Rehab Medication Review by a Pharmacist  Does the patient  feel that his/her medications are working for him/her?  yes  Has the patient been experiencing any side effects to the medications prescribed?  yes  Does the patient measure his/her own blood pressure or blood glucose at home?  no   Does the patient have any problems obtaining medications due to transportation or finances?   no  Understanding of regimen: good Understanding of indications: good Potential of compliance: good    Pharmacist comments: Pleasant 53 yo M with good understanding of medication regimen and reason for taking each. Pt expresses concern for anxiety feeling he gets after he takes his statin therapy (has experience with both atorvastatin and rosuvastatin). Pt also states he he is have a significant prolonging in the time he stops bleeding after cutting himself shaving or when skin tag gets bumped. Explained it almost takes 2 hours to stop bleeding. No blood in urine or stool. Pt has appointment with PA on this coming Tuesday and will relay these concerns to them at this time.   Thank you for allowing pharmacy to be part of this patient's care team  Metzli Pollick M. Kaden Dunkel, Pharm.D Clinical Pharmacy Resident Pager: [phone] 11/22/2013 .8:45 AM

## 2013-11-23 ENCOUNTER — Encounter (HOSPITAL_COMMUNITY): Payer: Self-pay | Admitting: Emergency Medicine

## 2013-11-23 ENCOUNTER — Observation Stay (HOSPITAL_COMMUNITY)
Admission: EM | Admit: 2013-11-23 | Discharge: 2013-11-23 | Disposition: A | Discharge status: Home / Self Care | Payer: BC Managed Care – PPO | Attending: Cardiovascular Disease | Admitting: Cardiovascular Disease

## 2013-11-23 ENCOUNTER — Encounter (HOSPITAL_COMMUNITY)
Admission: EM | Disposition: A | Discharge status: Home / Self Care | Payer: Self-pay | Source: Home / Self Care | Attending: Cardiology

## 2013-11-23 ENCOUNTER — Emergency Department (HOSPITAL_COMMUNITY): Payer: BC Managed Care – PPO

## 2013-11-23 DIAGNOSIS — I252 Old myocardial infarction: Secondary | ICD-10-CM | POA: Insufficient documentation

## 2013-11-23 DIAGNOSIS — R209 Unspecified disturbances of skin sensation: Principal | ICD-10-CM | POA: Insufficient documentation

## 2013-11-23 DIAGNOSIS — R29898 Other symptoms and signs involving the musculoskeletal system: Secondary | ICD-10-CM | POA: Diagnosis not present

## 2013-11-23 DIAGNOSIS — Z7982 Long term (current) use of aspirin: Secondary | ICD-10-CM | POA: Insufficient documentation

## 2013-11-23 DIAGNOSIS — E785 Hyperlipidemia, unspecified: Secondary | ICD-10-CM | POA: Diagnosis not present

## 2013-11-23 DIAGNOSIS — I2119 ST elevation (STEMI) myocardial infarction involving other coronary artery of inferior wall: Secondary | ICD-10-CM | POA: Diagnosis present

## 2013-11-23 DIAGNOSIS — I2589 Other forms of chronic ischemic heart disease: Secondary | ICD-10-CM | POA: Diagnosis not present

## 2013-11-23 DIAGNOSIS — Z7902 Long term (current) use of antithrombotics/antiplatelets: Secondary | ICD-10-CM | POA: Insufficient documentation

## 2013-11-23 DIAGNOSIS — I2510 Atherosclerotic heart disease of native coronary artery without angina pectoris: Secondary | ICD-10-CM

## 2013-11-23 DIAGNOSIS — R079 Chest pain, unspecified: Secondary | ICD-10-CM | POA: Diagnosis present

## 2013-11-23 DIAGNOSIS — I255 Ischemic cardiomyopathy: Secondary | ICD-10-CM | POA: Diagnosis present

## 2013-11-23 DIAGNOSIS — Z9861 Coronary angioplasty status: Secondary | ICD-10-CM | POA: Insufficient documentation

## 2013-11-23 HISTORY — DX: Acute myocardial infarction, unspecified: I21.9

## 2013-11-23 HISTORY — PX: LEFT HEART CATHETERIZATION WITH CORONARY ANGIOGRAM: SHX5451

## 2013-11-23 LAB — BASIC METABOLIC PANEL
ANION GAP: 12 (ref 5–15)
BUN: 19 mg/dL (ref 6–23)
CO2: 25 meq/L (ref 19–32)
Calcium: 8.8 mg/dL (ref 8.4–10.5)
Chloride: 106 mEq/L (ref 96–112)
Creatinine, Ser: 1.12 mg/dL (ref 0.50–1.35)
GFR calc non Af Amer: 74 mL/min — ABNORMAL LOW (ref >90)
GFR, EST AFRICAN AMERICAN: 86 mL/min — AB (ref >90)
Glucose, Bld: 100 mg/dL — ABNORMAL HIGH (ref 70–99)
Potassium: 3.7 mEq/L (ref 3.7–5.3)
Sodium: 143 mEq/L (ref 137–147)

## 2013-11-23 LAB — LIPID PANEL
CHOLESTEROL: 109 mg/dL (ref 0–200)
HDL: 35 mg/dL — ABNORMAL LOW (ref >39)
LDL CALC: 59 mg/dL (ref 0–99)
TRIGLYCERIDES: 74 mg/dL (ref <150)
Total CHOL/HDL Ratio: 3.1 RATIO
VLDL: 15 mg/dL (ref 0–40)

## 2013-11-23 LAB — URINALYSIS, ROUTINE W REFLEX MICROSCOPIC
BILIRUBIN URINE: NEGATIVE
Glucose, UA: NEGATIVE mg/dL
Hgb urine dipstick: NEGATIVE
Ketones, ur: 15 mg/dL — AB
LEUKOCYTES UA: NEGATIVE
NITRITE: NEGATIVE
Protein, ur: NEGATIVE mg/dL
SPECIFIC GRAVITY, URINE: 1.015 (ref 1.005–1.030)
UROBILINOGEN UA: 0.2 mg/dL (ref 0.0–1.0)
pH: 5.5 (ref 5.0–8.0)

## 2013-11-23 LAB — CBC WITH DIFFERENTIAL/PLATELET
Basophils Absolute: 0 10*3/uL (ref 0.0–0.1)
Basophils Relative: 0 % (ref 0–1)
Eosinophils Absolute: 0.1 10*3/uL (ref 0.0–0.7)
Eosinophils Relative: 1 % (ref 0–5)
HCT: 40.2 % (ref 39.0–52.0)
Hemoglobin: 13.8 g/dL (ref 13.0–17.0)
LYMPHS ABS: 1.3 10*3/uL (ref 0.7–4.0)
Lymphocytes Relative: 25 % (ref 12–46)
MCH: 30.3 pg (ref 26.0–34.0)
MCHC: 34.3 g/dL (ref 30.0–36.0)
MCV: 88.4 fL (ref 78.0–100.0)
Monocytes Absolute: 0.4 10*3/uL (ref 0.1–1.0)
Monocytes Relative: 8 % (ref 3–12)
NEUTROS PCT: 66 % (ref 43–77)
Neutro Abs: 3.3 10*3/uL (ref 1.7–7.7)
PLATELETS: 134 10*3/uL — AB (ref 150–400)
RBC: 4.55 MIL/uL (ref 4.22–5.81)
RDW: 12.4 % (ref 11.5–15.5)
WBC: 5 10*3/uL (ref 4.0–10.5)

## 2013-11-23 LAB — TROPONIN I

## 2013-11-23 LAB — APTT: aPTT: 31 seconds (ref 24–37)

## 2013-11-23 LAB — PROTIME-INR
INR: 1.12 (ref 0.00–1.49)
PROTHROMBIN TIME: 14.4 s (ref 11.6–15.2)

## 2013-11-23 LAB — I-STAT TROPONIN, ED: TROPONIN I, POC: 0 ng/mL (ref 0.00–0.08)

## 2013-11-23 LAB — CK: Total CK: 93 U/L (ref 7–232)

## 2013-11-23 SURGERY — LEFT HEART CATHETERIZATION WITH CORONARY ANGIOGRAM
Anesthesia: LOCAL

## 2013-11-23 MED ORDER — HEPARIN (PORCINE) IN NACL 2-0.9 UNIT/ML-% IJ SOLN
INTRAMUSCULAR | Status: AC
  Filled 2013-11-23: qty 1500

## 2013-11-23 MED ORDER — ACETAMINOPHEN 325 MG PO TABS: 650.0000 mg | ORAL_TABLET | ORAL | Status: DC | PRN

## 2013-11-23 MED ORDER — MIDAZOLAM HCL 2 MG/2ML IJ SOLN
INTRAMUSCULAR | Status: AC
  Filled 2013-11-23: qty 2

## 2013-11-23 MED ORDER — ASPIRIN EC 81 MG PO TBEC: 81.0000 mg | DELAYED_RELEASE_TABLET | Freq: Every day | ORAL | Status: DC

## 2013-11-23 MED ORDER — ASPIRIN 81 MG PO CHEW: 81.0000 mg | CHEWABLE_TABLET | ORAL | Status: DC

## 2013-11-23 MED ORDER — NITROGLYCERIN 0.4 MG SL SUBL: 0.4000 mg | SUBLINGUAL_TABLET | SUBLINGUAL | Status: DC | PRN

## 2013-11-23 MED ORDER — FENTANYL CITRATE 0.05 MG/ML IJ SOLN
INTRAMUSCULAR | Status: AC
  Filled 2013-11-23: qty 2

## 2013-11-23 MED ORDER — ROSUVASTATIN CALCIUM 20 MG PO TABS: 10.0000 mg | ORAL_TABLET | Freq: Every day | ORAL | Status: DC

## 2013-11-23 MED ORDER — LIDOCAINE HCL (PF) 1 % IJ SOLN
INTRAMUSCULAR | Status: AC
  Filled 2013-11-23: qty 30

## 2013-11-23 MED ORDER — ONDANSETRON HCL 4 MG/2ML IJ SOLN: 4.0000 mg | Freq: Four times a day (QID) | INTRAMUSCULAR | Status: DC | PRN

## 2013-11-23 MED ORDER — ASPIRIN EC 81 MG PO TBEC
81.0000 mg | DELAYED_RELEASE_TABLET | Freq: Every day | ORAL | Status: DC
  Filled 2013-11-23: qty 1

## 2013-11-23 MED ORDER — HEPARIN BOLUS VIA INFUSION
4000.0000 [IU] | Freq: Once | INTRAVENOUS | Status: DC
  Filled 2013-11-23: qty 4000

## 2013-11-23 MED ORDER — ASPIRIN 81 MG PO CHEW: 324.0000 mg | CHEWABLE_TABLET | ORAL | Status: DC

## 2013-11-23 MED ORDER — SODIUM CHLORIDE 0.9 % IV SOLN
1.0000 mL/kg/h | INTRAVENOUS | Status: DC
  Administered 2013-11-23: 1 mL/kg/h via INTRAVENOUS

## 2013-11-23 MED ORDER — PRASUGREL HCL 10 MG PO TABS: 10.0000 mg | ORAL_TABLET | Freq: Every day | ORAL | Status: DC

## 2013-11-23 MED ORDER — HEPARIN SODIUM (PORCINE) 1000 UNIT/ML IJ SOLN
INTRAMUSCULAR | Status: AC
  Filled 2013-11-23: qty 1

## 2013-11-23 MED ORDER — NON FORMULARY: 20.0000 mg | Freq: Every morning | Status: DC

## 2013-11-23 MED ORDER — ASPIRIN EC 81 MG PO TBEC
81.0000 mg | DELAYED_RELEASE_TABLET | Freq: Every day | ORAL | Status: DC
  Administered 2013-11-23: 81 mg via ORAL

## 2013-11-23 MED ORDER — PRASUGREL HCL 10 MG PO TABS
10.0000 mg | ORAL_TABLET | Freq: Every day | ORAL | Status: DC
  Administered 2013-11-23: 10 mg via ORAL
  Filled 2013-11-23: qty 1

## 2013-11-23 MED ORDER — SODIUM CHLORIDE 0.9 % IJ SOLN: 3.0000 mL | INTRAMUSCULAR | Status: DC | PRN

## 2013-11-23 MED ORDER — SODIUM CHLORIDE 0.9 % IV SOLN: 250.0000 mL | INTRAVENOUS | Status: DC | PRN

## 2013-11-23 MED ORDER — SODIUM CHLORIDE 0.9 % IJ SOLN: 3.0000 mL | Freq: Two times a day (BID) | INTRAMUSCULAR | Status: DC

## 2013-11-23 MED ORDER — SODIUM CHLORIDE 0.9 % IV BOLUS (SEPSIS)
1000.0000 mL | Freq: Once | INTRAVENOUS | Status: AC
  Administered 2013-11-23: 1000 mL via INTRAVENOUS

## 2013-11-23 MED ORDER — ROSUVASTATIN CALCIUM 20 MG PO TABS
20.0000 mg | ORAL_TABLET | Freq: Every day | ORAL | Status: DC
  Filled 2013-11-23: qty 1

## 2013-11-23 MED ORDER — HEPARIN (PORCINE) IN NACL 100-0.45 UNIT/ML-% IJ SOLN
1400.0000 [IU]/h | INTRAMUSCULAR | Status: DC
  Filled 2013-11-23: qty 250

## 2013-11-23 MED ORDER — VERAPAMIL HCL 2.5 MG/ML IV SOLN
INTRAVENOUS | Status: AC
  Filled 2013-11-23: qty 2

## 2013-11-23 MED ORDER — ASPIRIN 300 MG RE SUPP: 300.0000 mg | RECTAL | Status: DC

## 2013-11-23 MED ORDER — NITROGLYCERIN 1 MG/10 ML FOR IR/CATH LAB
INTRA_ARTERIAL | Status: AC
  Filled 2013-11-23: qty 10

## 2013-11-23 NOTE — Progress Notes (Signed)
ANTICOAGULATION CONSULT NOTE - Initial Consult  Pharmacy Consult for Heparin Indication: chest pain/ACS  Allergies  Allergen Reactions  . Lipitor [Atorvastatin]     Diarrhea     Patient Measurements: Height: 5\' 11"  (180.3 cm) Weight: 210 lb (95.255 kg) IBW/kg (Calculated) : 75.3   Vital Signs: Temp: 98.4 F (36.9 C) (08/28 0254) Temp src: Oral (08/28 0254) BP: 127/80 mmHg (08/28 1000) Pulse Rate: 64 (08/28 1000)  Labs:  Recent Labs  11/23/13 0420 11/23/13 0946  HGB 13.8  --   HCT 40.2  --   PLT 134*  --   APTT  --  31  LABPROT  --  14.4  INR  --  1.12  CREATININE 1.12  --   CKTOTAL 93  --   TROPONINI  --  <0.30    Estimated Creatinine Clearance: 90.9 ml/min (by C-G formula based on Cr of 1.12).   Medical History: Past Medical History  Diagnosis Date  . CAD (coronary artery disease), native coronary artery 10/30/2013  . Ischemic cardiomyopathy 10/30/2013    EF 45% akinesis on the entire inferior wall and severe hypokinesis of the basal and mid inferolateral walls   . Dyslipidemia 10/30/2013  . Acute MI 10/30/13    Assessment: 53 year old male with known ischemic heart and a recent MI now with bilateral arm numbness and weakness  Pharmacy to start heparin in preparation for cath  Goal of Therapy:  Heparin level 0.3-0.7 units/ml Monitor platelets by anticoagulation protocol: Yes   Plan:  1) Heparin 4000 units iv bolus x 1 2) Heparin drip at 1400 units / hr 3) Cath or heparin level  Thank you. Okey Regal, PharmD [phone]  Elwin Sleight 11/23/2013,10:57 AM

## 2013-11-23 NOTE — Progress Notes (Signed)
Pt discharged to home per MD order. Pt received and reviewed all discharge instructions and medication information including follow-up appointments and prescription information. Pt verbalized understanding. Pt alert and oriented at discharge with no complaints of pain. Pt IV and telemetry box removed prior to discharge. Pt escorted to private vehicle via wheelchair by guest services volunteer. Zaydrian Batta C. Dominika Losey, RN  

## 2013-11-23 NOTE — Discharge Instructions (Signed)
Radial Site Care °Refer to this sheet in the next few weeks. These instructions provide you with information on caring for yourself after your procedure. Your caregiver may also give you more specific instructions. Your treatment has been planned according to current medical practices, but problems sometimes occur. Call your caregiver if you have any problems or questions after your procedure. °HOME CARE INSTRUCTIONS °· You may shower the day after the procedure. Remove the bandage (dressing) and gently wash the site with plain soap and water. Gently pat the site dry. °· Do not apply powder or lotion to the site. °· Do not submerge the affected site in water for 3 to 5 days. °· Inspect the site at least twice daily. °· Do not flex or bend the affected arm for 24 hours. °· No lifting over 5 pounds (2.3 kg) for 5 days after your procedure. °· Do not drive home if you are discharged the same day of the procedure. Have someone else drive you. °· You may drive 24 hours after the procedure unless otherwise instructed by your caregiver. °· Do not operate machinery or power tools for 24 hours. °· A responsible adult should be with you for the first 24 hours after you arrive home. °What to expect: °· Any bruising will usually fade within 1 to 2 weeks. °· Blood that collects in the tissue (hematoma) may be painful to the touch. It should usually decrease in size and tenderness within 1 to 2 weeks. °SEEK IMMEDIATE MEDICAL CARE IF: °· You have unusual pain at the radial site. °· You have redness, warmth, swelling, or pain at the radial site. °· You have drainage (other than a small amount of blood on the dressing). °· You have chills. °· You have a fever or persistent symptoms for more than 72 hours. °· You have a fever and your symptoms suddenly get worse. °· Your arm becomes pale, cool, tingly, or numb. °· You have heavy bleeding from the site. Hold pressure on the site. °Document Released: 04/17/2010 Document Revised:  06/07/2011 Document Reviewed: 04/17/2010 °ExitCare® Patient Information ©2015 [employer]. This information is not intended to replace advice given to you by your health care provider. Make sure you discuss any questions you have with your health care provider. ° °

## 2013-11-23 NOTE — ED Provider Notes (Signed)
CSN: [id number]     Arrival date & time 11/23/13  0246 History   First MD Initiated Contact with Patient 11/23/13 0247     Chief Complaint  Patient presents with  . Allergic Reaction     (Consider location/radiation/quality/duration/timing/severity/associated sxs/prior Treatment) HPI Jimmy Goodwin is a 53 year old male with a past medical history of a stemi on August 4 coming in with numbness and tremors. Patient states she is laying in bed and he had sudden onset of shaking throughout his body. He should also follow his bilateral hands and bilateral feet going home. He has associated left shoulder pain described as burning. He felt like his bilateral biceps were tight. He also had some jaw pain. Patient states these symptoms are not consistent with his prior AMI., at that time he had significant chest pain and shortness of breath. Today patient is denying the symptoms at any point in time. Patient took a nitroglycerin which did not help his symptoms. If symptoms resolve spontaneously and is currently not experiencing any symptoms.   Past Medical History  Diagnosis Date  . CAD (coronary artery disease), native coronary artery 10/30/2013  . Ischemic cardiomyopathy 10/30/2013    EF 45% akinesis on the entire inferior wall and severe hypokinesis of the basal and mid inferolateral walls   . Dyslipidemia 10/30/2013  . Acute MI 10/30/13   Past Surgical History  Procedure Laterality Date  . Tonsillectomy    . Stemi  10/30/2013    PCI distal RCA with 3.5x20 mm Promus DES  . Coronary angioplasty with stent placement  10/31/2013    Staged PCI proximal Circumflex with 3.5 x 12 mm Promus DES   No family history on file. History  Substance Use Topics  . Smoking status: Never Smoker   . Smokeless tobacco: Not on file  . Alcohol Use: Yes    Review of Systems 10 Systems reviewed and are negative for acute change except as noted in the HPI.    Allergies  Lipitor  Home Medications   Prior to Admission  medications   Medication Sig Start Date End Date Taking? Authorizing Provider  aspirin EC 81 MG EC tablet Take 1 tablet (81 mg total) by mouth daily. 11/01/13  Yes Wilburt Finlay, PA-C  nitroGLYCERIN (NITROSTAT) 0.4 MG SL tablet Place 1 tablet (0.4 mg total) under the tongue every 5 (five) minutes x 3 doses as needed for chest pain. 11/01/13  Yes Wilburt Finlay, PA-C  prasugrel (EFFIENT) 10 MG TABS tablet Take 1 tablet (10 mg total) by mouth daily. 11/01/13  Yes Wilburt Finlay, PA-C  rosuvastatin (CRESTOR) 20 MG tablet Take 1 tablet (20 mg total) by mouth daily. 11/08/13  Yes Scott T Weaver, PA-C   BP 134/68  Pulse 66  Temp(Src) 98.4 F (36.9 C) (Oral)  Resp 13  Ht  (1.803 m)  Wt 210 lb (95.255 kg)  BMI 29.30 kg/m2  SpO2 99% Physical Exam  Nursing note and vitals reviewed. Constitutional: He is oriented to person, place, and time. Vital signs are normal. He appears well-developed and well-nourished.  Non-toxic appearance. He does not appear ill. No distress.  HENT:  Head: Normocephalic and atraumatic.  Nose: Nose normal.  Mouth/Throat: Oropharynx is clear and moist. No oropharyngeal exudate.  Eyes: Conjunctivae and EOM are normal. Pupils are equal, round, and reactive to light. No scleral icterus.  Neck: Normal range of motion. Neck supple. No tracheal deviation, no edema, no erythema and normal range of motion present. No mass and no thyromegaly  present.  Cardiovascular: Normal rate, regular rhythm, S1 normal, S2 normal, normal heart sounds, intact distal pulses and normal pulses.  Exam reveals no gallop and no friction rub.   No murmur heard. Pulses:      Radial pulses are 2+ on the right side, and 2+ on the left side.       Dorsalis pedis pulses are 2+ on the right side, and 2+ on the left side.  Pulmonary/Chest: Effort normal and breath sounds normal. No respiratory distress. He has no wheezes. He has no rhonchi. He has no rales.  Abdominal: Soft. Normal appearance and bowel sounds are  normal. He exhibits no distension, no ascites and no mass. There is no hepatosplenomegaly. There is no tenderness. There is no rebound, no guarding and no CVA tenderness.  Musculoskeletal: Normal range of motion. He exhibits no edema and no tenderness.  Lymphadenopathy:    He has no cervical adenopathy.  Neurological: He is alert and oriented to person, place, and time. He has normal strength. No cranial nerve deficit or sensory deficit. GCS eye subscore is 4. GCS verbal subscore is 5. GCS motor subscore is 6.  Skin: Skin is warm, dry and intact. No petechiae and no rash noted. He is not diaphoretic. No erythema. No pallor.  Psychiatric: He has a normal mood and affect. His behavior is normal. Judgment normal.    ED Course  Procedures (including critical care time) Labs Review Labs Reviewed  BASIC METABOLIC PANEL - Abnormal; Notable for the following:    Glucose, Bld 100 (*)    GFR calc non Af Amer 74 (*)    GFR calc Af Amer 86 (*)    All other components within normal limits  URINALYSIS, ROUTINE W REFLEX MICROSCOPIC - Abnormal; Notable for the following:    Ketones, ur 15 (*)    All other components within normal limits  CBC WITH DIFFERENTIAL - Abnormal; Notable for the following:    Platelets 134 (*)    All other components within normal limits  CK    Imaging Review Dg Chest 2 View  11/23/2013   CLINICAL DATA:  Chest pain.  Swelling or on the legs.  EXAM: CHEST  2 VIEW  COMPARISON:  10/30/2013  FINDINGS: The heart size and mediastinal contours are within normal limits. Both lungs are clear. The visualized skeletal structures are unremarkable.  IMPRESSION: No active cardiopulmonary disease.   Electronically Signed   By: Burman Nieves M.D.   On: 11/23/2013 03:52     EKG Interpretation None    NSR, TWI leads II,III,aVF  MDM   Final diagnoses:  None   Patient presents to the emergency department for onset of numbness and tingling in his hands and feet as well as diffuse  body tremors. He is currently pending a troponin level as the patient has new EKG findings, T wave inversions in leads II,III and aVF.  This is also pending a cardiology consult in the morning.  I suspect he'll be ultimately discharged. Please see the oncoming throughout his note for ultimate disposition of this patient.  Tomasita Crumble, MD 11/23/13 [phone]

## 2013-11-23 NOTE — CV Procedure (Signed)
    Cardiac Catheterization Procedure Note  Name: Jimmy Goodwin MRN: [id number] DOB: 1961/03/07  Procedure: Left Heart Cath, Selective Coronary Angiography, LV angiography  Indication: Chest pain concerning for unstable angina. This 53 year old gentleman had a recent inferior wall MI treated with primary PCI to right coronary artery. He underwent staged PCI left circumflex during his index hospitalization. He presented today with chest pain and was referred back for cardiac catheterization to evaluate for stent patency.   Procedural Details: The right wrist was prepped, draped, and anesthetized with 1% lidocaine. The pulse was weak on the right. I attempted radial access but was unsuccessful. Ultrasound was used in the radial artery was easy to visualize. However, there was no discernible flow in the vessel. Attention was then turned to the left radial artery. Using the modified Seldinger technique, a 5/6 French Slender sheath was introduced into the right radial artery. 3 mg of verapamil was administered through the sheath, weight-based unfractionated heparin was administered intravenously. Standard Judkins catheters were used for selective coronary angiography and left ventriculography. Catheter exchanges were performed over an exchange length guidewire. There were no immediate procedural complications. A TR band was used for radial hemostasis at the completion of the procedure.  The patient was transferred to the post catheterization recovery area for further monitoring.  Procedural Findings: Hemodynamics: AO 116/69 LV 116/14  Coronary angiography: Coronary dominance: right  Left mainstem: The left main is widely patent without significant obstruction. The vessel divides into the LAD and left circumflex.  Left anterior descending (LAD): The LAD is diffusely diseased throughout its proximal segment. There is diffuse calcification and a long segment of about 40% stenosis. The mid and distal LAD  are widely patent.  Left circumflex (LCx): The left circumflex is widely patent. The stented segment is patent with no significant in-stent restenosis.  Right coronary artery (RCA): This is a dominant vessel. The proximal vessel is patent. The mid vessel has 30-40% stenosis. The distal vessel is widely patent. There is a long stented segment with no significant in-stent restenosis. The PDA has ostial stenosis of 50-60% which is unchanged from the previous study. The posterior AV segment and PLA branches are patent with mild irregularity noted.  Left ventriculography: The anterior and apical walls contract normally. The mid and basal inferior wall is hypokinetic. The estimated LVEF is within expected limits of 55-60%.  Contrast: 70 cc Omnipaque  Radiation dose/Fluoro time: 3.7 minutes  Estimated Blood Loss: Minimal  Final Conclusions:   1. Continued patency of the stented segments in the right coronary artery and left circumflex 2. Nonobstructive LAD stenosis 3. Mild segmental contraction abnormality of the left ventricle with preserved overall LVEF  Recommendations: Ongoing medical therapy. Suspect medication side effect causing this patient's symptoms. He is on minimal post MI therapy because of side effects and low blood pressure. Will try to reduce his statin drug a little further.  [name] MD, [facility] 11/23/2013, 1:35 PM

## 2013-11-23 NOTE — ED Notes (Signed)
1 SL nitro PTA for chest pain, numbness in hands, feet and biceps. Previous reaction to lipitor. Per wife, tonight's symptoms are the same as reaction to lipitor

## 2013-11-23 NOTE — Discharge Summary (Signed)
Patient ID: Jimmy Goodwin,  MRN: [id number], DOB/AGE: Oct 21, 1960 53 y.o.  Admit date: 11/23/2013 Discharge date: 11/23/2013  Primary Care Provider: No PCP Per Patient Primary Cardiologist: Dr Excell Seltzer  Discharge Diagnoses Principal Problem:   Chest pain with moderate risk of acute coronary syndrome Active Problems:   STEMI 10/31/13 of inferior wall   CAD- S/P RCA DES 10/30/13 with staged CFX DES 11/01/13   Cardiomyopathy, ischemic   Dyslipidemia    Procedures; Coronary angiogram 11/23/13    Hospital Course:  53 year old gentleman with known ischemic heart disease and a recent infarction- s/p RCA DES 10/30/13 followed by CFX DES 11/01/13, presented 11/23/13 with bilateral arm numbness and weakness. Troponin was negative. It was decided to proceed with cath. This revealed patent RCA and CFX. Dr Excell Seltzer felt some of symptoms may be from statin Rx. He has had problems with this in the past. We decreased his Crestor at discharge. He had an apt with Tereso Newcomer PA on 11/27/13 but this will be pushed back a few weeks. He will be discharged later on the 28th.    Discharge Vitals:  Blood pressure 112/68, pulse 60, temperature 98.7 F (37.1 C), temperature source Oral, resp. rate 15, height  (1.803 m), weight 209 lb (94.802 kg), SpO2 98.00%.    Labs: Results for orders placed during the hospital encounter of 11/23/13 (from the past 24 hour(s))  BASIC METABOLIC PANEL     Status: Abnormal   Collection Time    11/23/13  4:20 AM      Result Value Ref Range   Sodium 143  137 - 147 mEq/L   Potassium 3.7  3.7 - 5.3 mEq/L   Chloride 106  96 - 112 mEq/L   CO2 25  19 - 32 mEq/L   Glucose, Bld 100 (*) 70 - 99 mg/dL   BUN 19  6 - 23 mg/dL   Creatinine, Ser 9.56  0.50 - 1.35 mg/dL   Calcium 8.8  8.4 - 21.3 mg/dL   GFR calc non Af Amer 74 (*) >90 mL/min   GFR calc Af Amer 86 (*) >90 mL/min   Anion gap 12  5 - 15  CK     Status: None   Collection Time    11/23/13  4:20 AM      Result Value Ref  Range   Total CK 93  7 - 232 U/L  CBC WITH DIFFERENTIAL     Status: Abnormal   Collection Time    11/23/13  4:20 AM      Result Value Ref Range   WBC 5.0  4.0 - 10.5 K/uL   RBC 4.55  4.22 - 5.81 MIL/uL   Hemoglobin 13.8  13.0 - 17.0 g/dL   HCT 08.6  57.8 - 46.9 %   MCV 88.4  78.0 - 100.0 fL   MCH 30.3  26.0 - 34.0 pg   MCHC 34.3  30.0 - 36.0 g/dL   RDW 62.9  52.8 - 41.3 %   Platelets 134 (*) 150 - 400 K/uL   Neutrophils Relative % 66  43 - 77 %   Neutro Abs 3.3  1.7 - 7.7 K/uL   Lymphocytes Relative 25  12 - 46 %   Lymphs Abs 1.3  0.7 - 4.0 K/uL   Monocytes Relative 8  3 - 12 %   Monocytes Absolute 0.4  0.1 - 1.0 K/uL   Eosinophils Relative 1  0 - 5 %   Eosinophils Absolute 0.1  0.0 - 0.7 K/uL   Basophils Relative 0  0 - 1 %   Basophils Absolute 0.0  0.0 - 0.1 K/uL  URINALYSIS, ROUTINE W REFLEX MICROSCOPIC     Status: Abnormal   Collection Time    11/23/13  4:21 AM      Result Value Ref Range   Color, Urine YELLOW  YELLOW   APPearance CLEAR  CLEAR   Specific Gravity, Urine 1.015  1.005 - 1.030   pH 5.5  5.0 - 8.0   Glucose, UA NEGATIVE  NEGATIVE mg/dL   Hgb urine dipstick NEGATIVE  NEGATIVE   Bilirubin Urine NEGATIVE  NEGATIVE   Ketones, ur 15 (*) NEGATIVE mg/dL   Protein, ur NEGATIVE  NEGATIVE mg/dL   Urobilinogen, UA 0.2  0.0 - 1.0 mg/dL   Nitrite NEGATIVE  NEGATIVE   Leukocytes, UA NEGATIVE  NEGATIVE  I-STAT TROPOININ, ED     Status: None   Collection Time    11/23/13  6:24 AM      Result Value Ref Range   Troponin i, poc 0.00  0.00 - 0.08 ng/mL   Comment 3           TROPONIN I     Status: None   Collection Time    11/23/13  9:46 AM      Result Value Ref Range   Troponin I <0.30  <0.30 ng/mL  PROTIME-INR     Status: None   Collection Time    11/23/13  9:46 AM      Result Value Ref Range   Prothrombin Time 14.4  11.6 - 15.2 seconds   INR 1.12  0.00 - 1.49  APTT     Status: None   Collection Time    11/23/13  9:46 AM      Result Value Ref Range   aPTT 31   24 - 37 seconds  LIPID PANEL     Status: Abnormal   Collection Time    11/23/13  9:46 AM      Result Value Ref Range   Cholesterol 109  0 - 200 mg/dL   Triglycerides 74  <161 mg/dL   HDL 35 (*) >09 mg/dL   Total CHOL/HDL Ratio 3.1     VLDL 15  0 - 40 mg/dL   LDL Cholesterol 59  0 - 99 mg/dL    Disposition:      Follow-up Information   Follow up with [name], MD In 1 month. (Office will call ( I cancelled your 11/27/13 apt with Tereso Newcomer PA))    Specialty:  Cardiology   Contact information:   [address] [place] [address] [phone]       Discharge Medications:    Medication List         acetaminophen 325 MG tablet  Commonly known as:  TYLENOL  Take 2 tablets (650 mg total) by mouth every 4 (four) hours as needed for headache or mild pain.     aspirin 81 MG EC tablet  Take 1 tablet (81 mg total) by mouth daily.     nitroGLYCERIN 0.4 MG SL tablet  Commonly known as:  NITROSTAT  Place 1 tablet (0.4 mg total) under the tongue every 5 (five) minutes x 3 doses as needed for chest pain.     prasugrel 10 MG Tabs tablet  Commonly known as:  EFFIENT  Take 1 tablet (10 mg total) by mouth daily.     rosuvastatin 20 MG tablet  Commonly known as:  CRESTOR  Take 0.5 tablets (10 mg total) by mouth daily.         Duration of Discharge Encounter: Greater than 30 minutes including physician time.  Jolene Provost PA-C 11/23/2013 4:57 PM

## 2013-11-23 NOTE — Interval H&P Note (Signed)
History and Physical Interval Note:  11/23/2013 12:41 PM  Jimmy Goodwin  has presented today for surgery, with the diagnosis of cp  The various methods of treatment have been discussed with the patient and family. After consideration of risks, benefits and other options for treatment, the patient has consented to  Procedure(s): LEFT HEART CATHETERIZATION WITH CORONARY ANGIOGRAM (N/A) as a surgical intervention .  The patient's history has been reviewed, patient examined, no change in status, stable for surgery.  I have reviewed the patient's chart and labs.  Questions were answered to the patient's satisfaction.    Cath Lab Visit (complete for each Cath Lab visit)  Clinical Evaluation Leading to the Procedure:   ACS: No.  Non-ACS:    Anginal Classification: CCS IV  Anti-ischemic medical therapy: No Therapy  Non-Invasive Test Results: No non-invasive testing performed  Prior CABG: No previous CABG       [name]

## 2013-11-23 NOTE — ED Notes (Signed)
Pt arrives via EMS with allergic reaction, switched to crestor about two weeks ago. About 2 hours ago, started having shakes. No SHOB. Cold fingers. En route to hospital, shaking stopped. CBG 91. Last set VS 126/74, HR 73, 16 RR, 97% on RA. 20G IV L AC.

## 2013-11-23 NOTE — Progress Notes (Signed)
UR Completed Vaneza Pickart Graves-Bigelow, RN,BSN [phone]  

## 2013-11-23 NOTE — H&P (Signed)
Patient ID: Jimmy Goodwin MRN: [id number], DOB/AGE: 52-25-1962   Admit date: 11/23/2013   Primary Physician: No PCP Per Patient Primary Cardiologist: Dr. Excell Seltzer  Pt. Profile:  53 year old gentleman with known ischemic heart disease and a recent infarction presents with bilateral arm numbness and weakness.  Problem List  Past Medical History  Diagnosis Date  . CAD (coronary artery disease), native coronary artery 10/30/2013  . Ischemic cardiomyopathy 10/30/2013    EF 45% akinesis on the entire inferior wall and severe hypokinesis of the basal and mid inferolateral walls   . Dyslipidemia 10/30/2013  . Acute MI 10/30/13    Past Surgical History  Procedure Laterality Date  . Tonsillectomy    . Stemi  10/30/2013    PCI distal RCA with 3.5x20 mm Promus DES  . Coronary angioplasty with stent placement  10/31/2013    Staged PCI proximal Circumflex with 3.5 x 12 mm Promus DES     Allergies  Allergies  Allergen Reactions  . Lipitor [Atorvastatin]     Diarrhea     HPI  This 53 year old gentleman has known coronary artery disease.  He presented with a Duffy Rhody on 10/30/2013 and underwent emergent PCI of the distal right coronary artery with a 3.5x20 mm Promus drug-eluting stent by Dr. Excell Seltzer.  The following day he underwent a staged PCI of the proximal circumflex with a 3.5x12 mm Promus stent by Dr. Clifton James.  Echocardiogram at the time of his initial presentation showed mild left ventricular systolic dysfunction.  Several days after discharge the patient was seen in [facility] emergency room on 11/04/13 numbness and was told that it might be an adverse drug effect from Lipitor Lipitor was held.  He was subsequently started on Crestor.  Last evening he was lying in bed and the back sense of overall body weakness associated with numbness of his arms and mild discomfort in the left upper chest and left shoulder area.  His wife called 911.  He walked a short distance to the ambulance and  noted slight chest tightness and shortness of breath.  At the present time he feels better and is not having any active discomfort.  EKG today shows new T-wave inversions which were not seen on 11/04/13 but were present on 11/01/13.  The patient is very anxious about his situation.  He is a IT trainer.  He went back to work earlier this week.  He went to the orientation session of the cardiac rehabilitation program yesterday.  Home Medications  Prior to Admission medications   Medication Sig Start Date End Date Taking? Authorizing Provider  aspirin EC 81 MG EC tablet Take 1 tablet (81 mg total) by mouth daily. 11/01/13  Yes Wilburt Finlay, PA-C  nitroGLYCERIN (NITROSTAT) 0.4 MG SL tablet Place 1 tablet (0.4 mg total) under the tongue every 5 (five) minutes x 3 doses as needed for chest pain. 11/01/13  Yes Wilburt Finlay, PA-C  prasugrel (EFFIENT) 10 MG TABS tablet Take 1 tablet (10 mg total) by mouth daily. 11/01/13  Yes Wilburt Finlay, PA-C  rosuvastatin (CRESTOR) 20 MG tablet Take 1 tablet (20 mg total) by mouth daily. 11/08/13  Yes Beatrice Lecher, PA-C    Family History  No family history of premature coronary disease.  Both of his parents are living  Social History  History   Social History  . Marital Status: Married    Spouse Name: N/A    Number of Children: N/A  . Years of Education: N/A  Occupational History  . Not on file.   Social History Main Topics  . Smoking status: Never Smoker   . Smokeless tobacco: Not on file  . Alcohol Use: Yes  . Drug Use: No  . Sexual Activity: Not on file   Other Topics Concern  . Not on file   Social History Narrative  . No narrative on file     Review of Systems General:  No chills, fever, night sweats or weight changes.  Cardiovascular:  No chest pain, dyspnea on exertion, edema, orthopnea, palpitations, paroxysmal nocturnal dyspnea. Dermatological: No rash, lesions/masses Respiratory: No cough, dyspnea Urologic: No hematuria,  dysuria Abdominal:   No nausea, vomiting, diarrhea, bright red blood per rectum, melena, or hematemesis Neurologic:  No visual changes, wkns, changes in mental status. All other systems reviewed and are otherwise negative except as noted above.  Physical Exam  Blood pressure 131/68, pulse 74, temperature 98.4 F (36.9 C), temperature source Oral, resp. rate 16, height 5\' 11"  (1.803 m), weight 210 lb (95.255 kg), SpO2 100.00%.  General: Pleasant, NAD Psych: Normal affect.  Mildly anxious. Neuro: Alert and oriented X 3. Moves all extremities spontaneously. HEENT: Normal  Neck: Supple without bruits or JVD. Lungs:  Resp regular and unlabored, CTA. Heart: RRR no s3, s4, or murmurs. Abdomen: Soft, non-tender, non-distended, BS + x 4.  Extremities: No clubbing, cyanosis or edema. DP/PT/Radials 2+ and equal bilaterally.  Labs  Troponin [facility] Test)  Recent Labs  11/23/13 0624  TROPIPOC 0.00    Recent Labs  11/23/13 0420  CKTOTAL 93   Lab Results  Component Value Date   WBC 5.0 11/23/2013   HGB 13.8 11/23/2013   HCT 40.2 11/23/2013   MCV 88.4 11/23/2013   PLT 134* 11/23/2013     Recent Labs Lab 11/23/13 0420  NA 143  K 3.7  CL 106  CO2 25  BUN 19  CREATININE 1.12  CALCIUM 8.8  GLUCOSE 100*   Lab Results  Component Value Date   CHOL 216* 10/30/2013   HDL 38* 10/30/2013   LDLCALC 146* 10/30/2013   TRIG 159* 10/30/2013   No results found for this basename: DDIMER     Radiology/Studies  Dg Chest 2 View  11/23/2013   CLINICAL DATA:  Chest pain.  Swelling or on the legs.  EXAM: CHEST  2 VIEW  COMPARISON:  10/30/2013  FINDINGS: The heart size and mediastinal contours are within normal limits. Both lungs are clear. The visualized skeletal structures are unremarkable.  IMPRESSION: No active cardiopulmonary disease.   Electronically Signed   By: Burman Nieves M.D.   On: 11/23/2013 03:52   Dg Chest Portable 1 View  10/30/2013   CLINICAL DATA:  Chest pain.  Code STEMI.   EXAM: PORTABLE CHEST - 1 VIEW  COMPARISON:  None.  FINDINGS: Normal heart size and mediastinal contours. No acute infiltrate or edema. No effusion or pneumothorax. No acute osseous findings.  IMPRESSION: No active disease.   Electronically Signed   By: Tiburcio Pea M.D.   On: 10/30/2013 02:25    ECG  Normal sinus rhythm.  T-wave inversion new since prior tracing in inferior leads.  However tracing is similar to the earlier tracing of 11/01/13  ASSESSMENT AND PLAN  1. ischemic heart disease status post recent STEMI of inferolateral wall on 10/30/13.Marland Kitchen  Recent drug-eluting stents to right coronary artery and circumflex. 2. arm numbness uncertain etiology possibly ischemic.  Patient is also concerned that he might be having an  adverse effect from the statins  Plan: Admit.  Proceed with left heart cardiac catheterization later today.  Continue dual antiplatelet therapy.  If no problem with his stents is found, consider further change or reduction of statin therapy. Signed, Cassell Clement, MD  11/23/2013, 8:40 AM

## 2013-11-27 ENCOUNTER — Telehealth: Payer: Self-pay | Admitting: Cardiovascular Disease

## 2013-11-27 ENCOUNTER — Encounter: Payer: BC Managed Care – PPO | Admitting: Physician Assistant

## 2013-11-27 NOTE — Telephone Encounter (Signed)
Spoke with patient and reviewed his symptoms. He is now doing fine. He will continue on same meds. I think he had a severe reaction to sublingual NTG and I have recommended that we add NTG to his Allergy list. He will follow-up as planned. RTN to work Thursday, start Cardiac Rehab tomorrow. Advised to stay well-hydrated.  Jimmy Goodwin 11/27/2013 12:24 PM

## 2013-11-27 NOTE — Telephone Encounter (Signed)
Nitroglycerin added to allergies.

## 2013-11-28 ENCOUNTER — Encounter (HOSPITAL_COMMUNITY)
Admission: RE | Admit: 2013-11-28 | Discharge: 2013-11-28 | Disposition: A | Discharge status: Home / Self Care | Payer: BC Managed Care – PPO | Source: Ambulatory Visit | Attending: Cardiovascular Disease | Admitting: Cardiovascular Disease

## 2013-11-28 DIAGNOSIS — Z9861 Coronary angioplasty status: Secondary | ICD-10-CM | POA: Diagnosis not present

## 2013-11-28 DIAGNOSIS — Z5189 Encounter for other specified aftercare: Secondary | ICD-10-CM | POA: Diagnosis not present

## 2013-11-28 DIAGNOSIS — I714 Abdominal aortic aneurysm, without rupture, unspecified: Secondary | ICD-10-CM | POA: Insufficient documentation

## 2013-11-28 DIAGNOSIS — E785 Hyperlipidemia, unspecified: Secondary | ICD-10-CM | POA: Insufficient documentation

## 2013-11-28 DIAGNOSIS — I2510 Atherosclerotic heart disease of native coronary artery without angina pectoris: Secondary | ICD-10-CM | POA: Diagnosis not present

## 2013-11-28 DIAGNOSIS — I2589 Other forms of chronic ischemic heart disease: Secondary | ICD-10-CM | POA: Insufficient documentation

## 2013-11-28 DIAGNOSIS — I252 Old myocardial infarction: Secondary | ICD-10-CM | POA: Diagnosis not present

## 2013-11-28 NOTE — Progress Notes (Signed)
Jimmy Goodwin started cardiac rehab today. Telemetry rhythm Sinus with T wave inversion. This was previously documented on 11/23/13 12 lead ECG.  Vital signs stable. Will continue to monitor the patient throughout  the program. No complaints or chest pain today.

## 2013-11-30 ENCOUNTER — Encounter (HOSPITAL_COMMUNITY)
Admission: RE | Admit: 2013-11-30 | Discharge: 2013-11-30 | Disposition: A | Discharge status: Home / Self Care | Payer: BC Managed Care – PPO | Source: Ambulatory Visit | Attending: Cardiovascular Disease | Admitting: Cardiovascular Disease

## 2013-11-30 ENCOUNTER — Encounter: Payer: Self-pay | Admitting: Cardiovascular Disease

## 2013-11-30 ENCOUNTER — Telehealth: Payer: Self-pay | Admitting: Cardiovascular Disease

## 2013-11-30 DIAGNOSIS — Z5189 Encounter for other specified aftercare: Secondary | ICD-10-CM | POA: Diagnosis not present

## 2013-11-30 NOTE — Telephone Encounter (Signed)
I spoke with the pt's wife and she said the pt is very anxious about his diagnosis and is upset that he is not in control of his body at this time.  She said the pt spent all day Monday looking up information on the Internet about his medications and diagnosis.  Tuesday the pt was very upset all day about his appointment being cancelled and even after speaking with Dr Excell Seltzer the pt could not calm himself down.  Wednesday the pt started cardiac rehab and did well. 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 When the pt got home he was upset and "worked up" per wife and complained of nausea and pressure in his throat. She feels like he is having issues with anxiety and panic attacks.  The pt does not have a PCP at this time. I will forward this message to Dr Excell Seltzer for review and further recommendations.

## 2013-11-30 NOTE — Telephone Encounter (Signed)
New message      Talk to Lauren.  Husband is having "issues".

## 2013-11-30 NOTE — Telephone Encounter (Signed)
This encounter was created in error - please disregard.

## 2013-11-30 NOTE — Telephone Encounter (Signed)
New problem   Jimmy Goodwin need to speak to you concerning pt not feeling well after his cath. Please call.

## 2013-11-30 NOTE — Telephone Encounter (Signed)
Recommend that he stays consistent with cardiac rehab. These symptoms are very common after a heart attack. Really needs to establish with a PCP and may ultimately need to be on medication for anxiety or depression, but will have to review that with primary doctor.   [name] 11/30/2013 1:41 PM

## 2013-11-30 NOTE — Telephone Encounter (Signed)
Please have Dr Earmon Phoenix nurse call her.She said she not get thru on [phone].

## 2013-11-30 NOTE — Telephone Encounter (Signed)
I spoke with the pt's wife and made her aware of Dr Earmon Phoenix comments.  She will contact the [facility] location at the [facility] to arrange appointment.

## 2013-11-30 NOTE — Progress Notes (Signed)
Patient reports feeling tired and fatigued since hospitalization. Patient says he felt very anxious yesterday evening with an elevated heart rate. Jimmy Goodwin said it took him several hours to calm down. Jimmy Goodwin asked me if this is normal. Patient reassured. Vital signs stable. No complaints of chest pain during exercise.  Jimmy Goodwin does not have a primary care physician. Patient encouraged to establish a primary care physician. Jimmy Goodwin talked with Dr Earmon Phoenix nurse Leotis Shames over the phone she reassured him as well. Will continue to monitor the patient throughout  the program.

## 2013-12-03 ENCOUNTER — Encounter (HOSPITAL_COMMUNITY): Payer: BC Managed Care – PPO

## 2013-12-05 ENCOUNTER — Encounter (HOSPITAL_COMMUNITY)
Admission: RE | Admit: 2013-12-05 | Discharge: 2013-12-05 | Disposition: A | Discharge status: Home / Self Care | Payer: BC Managed Care – PPO | Source: Ambulatory Visit | Attending: Cardiovascular Disease | Admitting: Cardiovascular Disease

## 2013-12-05 DIAGNOSIS — Z5189 Encounter for other specified aftercare: Secondary | ICD-10-CM | POA: Diagnosis not present

## 2013-12-07 ENCOUNTER — Encounter (HOSPITAL_COMMUNITY)
Admission: RE | Admit: 2013-12-07 | Discharge: 2013-12-07 | Disposition: A | Discharge status: Home / Self Care | Payer: BC Managed Care – PPO | Source: Ambulatory Visit | Attending: Cardiovascular Disease | Admitting: Cardiovascular Disease

## 2013-12-07 DIAGNOSIS — Z5189 Encounter for other specified aftercare: Secondary | ICD-10-CM | POA: Diagnosis not present

## 2013-12-10 ENCOUNTER — Encounter (HOSPITAL_COMMUNITY)
Admission: RE | Admit: 2013-12-10 | Discharge: 2013-12-10 | Disposition: A | Discharge status: Home / Self Care | Payer: BC Managed Care – PPO | Source: Ambulatory Visit | Attending: Cardiovascular Disease | Admitting: Cardiovascular Disease

## 2013-12-10 DIAGNOSIS — Z5189 Encounter for other specified aftercare: Secondary | ICD-10-CM | POA: Diagnosis not present

## 2013-12-10 NOTE — Progress Notes (Signed)
Reviewed home exercise with pt today.  Pt plans to walk at home and use hand weights at home for exercise.  Reviewed THR, pulse, RPE, sign and symptoms, and when to call 911 or MD.  Pt voiced understanding. Fabio Pierce, MA, ACSM RCEP

## 2013-12-12 ENCOUNTER — Encounter (HOSPITAL_COMMUNITY)
Admission: RE | Admit: 2013-12-12 | Discharge: 2013-12-12 | Disposition: A | Discharge status: Home / Self Care | Payer: BC Managed Care – PPO | Source: Ambulatory Visit | Attending: Cardiovascular Disease | Admitting: Cardiovascular Disease

## 2013-12-12 DIAGNOSIS — Z5189 Encounter for other specified aftercare: Secondary | ICD-10-CM | POA: Diagnosis not present

## 2013-12-12 NOTE — Progress Notes (Signed)
Intermittent PAC's noted today during exercise patient asymptomatic. Vital signs stable.  Ron says he drinks a Pepsi at night with dinner otherwise Ron says he consumes very little caffeine.   I asked Ron to switch to caffeine free if he can. Will fax exercise flow sheets to Dr. Earmon Phoenix office for review.

## 2013-12-14 ENCOUNTER — Encounter (HOSPITAL_COMMUNITY)
Admission: RE | Admit: 2013-12-14 | Discharge: 2013-12-14 | Disposition: A | Discharge status: Home / Self Care | Payer: BC Managed Care – PPO | Source: Ambulatory Visit | Attending: Cardiovascular Disease | Admitting: Cardiovascular Disease

## 2013-12-14 DIAGNOSIS — Z5189 Encounter for other specified aftercare: Secondary | ICD-10-CM | POA: Diagnosis not present

## 2013-12-17 ENCOUNTER — Encounter (HOSPITAL_COMMUNITY)
Admission: RE | Admit: 2013-12-17 | Discharge: 2013-12-17 | Disposition: A | Discharge status: Home / Self Care | Payer: BC Managed Care – PPO | Source: Ambulatory Visit | Attending: Cardiovascular Disease | Admitting: Cardiovascular Disease

## 2013-12-17 DIAGNOSIS — Z5189 Encounter for other specified aftercare: Secondary | ICD-10-CM | POA: Diagnosis not present

## 2013-12-19 ENCOUNTER — Encounter (HOSPITAL_COMMUNITY)
Admission: RE | Admit: 2013-12-19 | Discharge: 2013-12-19 | Disposition: A | Discharge status: Home / Self Care | Payer: BC Managed Care – PPO | Source: Ambulatory Visit | Attending: Cardiovascular Disease | Admitting: Cardiovascular Disease

## 2013-12-19 DIAGNOSIS — Z5189 Encounter for other specified aftercare: Secondary | ICD-10-CM | POA: Diagnosis not present

## 2013-12-21 ENCOUNTER — Encounter (HOSPITAL_COMMUNITY)
Admission: RE | Admit: 2013-12-21 | Discharge: 2013-12-21 | Disposition: A | Discharge status: Home / Self Care | Payer: BC Managed Care – PPO | Source: Ambulatory Visit | Attending: Cardiovascular Disease | Admitting: Cardiovascular Disease

## 2013-12-21 DIAGNOSIS — Z5189 Encounter for other specified aftercare: Secondary | ICD-10-CM | POA: Diagnosis not present

## 2013-12-24 ENCOUNTER — Encounter (HOSPITAL_COMMUNITY)
Admission: RE | Admit: 2013-12-24 | Discharge: 2013-12-24 | Disposition: A | Discharge status: Home / Self Care | Payer: BC Managed Care – PPO | Source: Ambulatory Visit | Attending: Cardiovascular Disease | Admitting: Cardiovascular Disease

## 2013-12-24 DIAGNOSIS — Z5189 Encounter for other specified aftercare: Secondary | ICD-10-CM | POA: Diagnosis not present

## 2013-12-24 NOTE — Progress Notes (Signed)
Jimmy Goodwin 53 y.o. male Nutrition Note Spoke with pt.  Nutrition Survey reviewed with pt. Pt is following Step 1 of the Therapeutic Lifestyle Changes diet. Pt wants to lose wt. Pt has been trying to lose wt by watching what he eats and exercising. Pt states his wt today is 93.7 kg (206 lb). Wt loss tips briefly reviewed. Pt expressed understanding of the information reviewed. Pt aware of nutrition education classes offered.  Nutrition Diagnosis   Food-and nutrition-related knowledge deficit related to lack of exposure to information as related to diagnosis of: ? CVD    Overweight related to excessive energy intake as evidenced by a BMI of 29.7  Nutrition Intervention   Benefits of adopting Therapeutic Lifestyle Changes discussed when Medficts reviewed.   Pt to attend the Portion Distortion class   Pt to attend the  ? Nutrition I class                     ? Nutrition II class   Pt given handouts for: ? Nutrition I class ? Nutrition II class    Continue client-centered nutrition education by RD, as part of interdisciplinary care.  Goal(s)   Pt to watch out for sweets and added sugar.   Pt to identify and limit food sources of saturated fat, trans fat, and cholesterol   Pt to identify food quantities necessary to achieve: ? wt loss to a goal wt of 186-204 lb (84.4-92.6 kg) at graduation from cardiac rehab.   Monitor and Evaluate progress toward nutrition goal with team.   Mickle Plumb, M.Ed, RD, LDN, CDE 12/24/2013 3:27 PM

## 2013-12-26 ENCOUNTER — Encounter (HOSPITAL_COMMUNITY)
Admission: RE | Admit: 2013-12-26 | Discharge: 2013-12-26 | Disposition: A | Discharge status: Home / Self Care | Payer: BC Managed Care – PPO | Source: Ambulatory Visit | Attending: Cardiovascular Disease | Admitting: Cardiovascular Disease

## 2013-12-26 DIAGNOSIS — Z5189 Encounter for other specified aftercare: Secondary | ICD-10-CM | POA: Diagnosis not present

## 2013-12-27 ENCOUNTER — Encounter: Payer: Self-pay | Admitting: Physician Assistant

## 2013-12-27 ENCOUNTER — Ambulatory Visit (INDEPENDENT_AMBULATORY_CARE_PROVIDER_SITE_OTHER): Payer: BC Managed Care – PPO | Admitting: Physician Assistant

## 2013-12-27 VITALS — BP 100/66 | HR 57 | Ht 71.0 in | Wt 205.0 lb

## 2013-12-27 DIAGNOSIS — E785 Hyperlipidemia, unspecified: Secondary | ICD-10-CM

## 2013-12-27 DIAGNOSIS — I2510 Atherosclerotic heart disease of native coronary artery without angina pectoris: Secondary | ICD-10-CM

## 2013-12-27 NOTE — Patient Instructions (Signed)
Your physician recommends that you continue on your current medications as directed. Please refer to the Current Medication list given to you today.   Your physician recommends that you return for lab work in: FASTING CHOLESTEROL PANEL TO BE DONE IN 4 WEEKS  Your physician recommends that you schedule a follow-up appointment in: 2 MONTHS WITH DR. Excell Seltzer

## 2013-12-27 NOTE — Progress Notes (Signed)
Cardiology Office Note   Date:  12/27/2013   ID:  Jimmy Goodwin Mansfield, DOB May 09, 1960, MRN [id number]  PCP:  No PCP Per Patient  Cardiologist:  Dr. Tonny BollmanMichael Cooper     History of Present Illness: Jimmy Goodwin Lemen is a 53 y.o. 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  He had residual critical stenosis in the circumflex which was treated with a Promus DES in a staged intervention fashion.  He had mild LV dysfunction with EF 45% and inferior akinesis.  Patient was readmitted 11/23/13 with bilateral arm numbness and weakness. Cardiac enzymes remained normal. Re-look cardiac catheterization demonstrated patent circumflex and patent RCA stents. He had mild to moderate nonobstructive disease elsewhere with improved LV function with an EF of 55-60%.  Symptoms were felt to be related to medication side effects. Dose of Crestor was reduced further.  Patient had also called in with some reactions to NTG.  Therefore, NTG was added to his allergy list.  He returns for FU.    He is doing well.  The patient denies chest pain, shortness of breath, syncope, orthopnea, PND or significant pedal edema. He was feeling quite nervous and anxious after his MI.  This is improved.  He is doing well with cardiac rehab.     Studies:  - LHC (8/15):  Proximal LAD 40%, circumflex stent patent, mid RCA 30-40%, distal RCA stent patent, ostial PDA 50-60%, EF 55-60%, inferior HK  - Echo (8/15):  Inferior akinesis, anterolateral HK, EF 45%, mild AI.    Recent Labs/Images:   Recent Labs  11/05/13 0045 11/23/13 0420 11/23/13 0946  NA 140 143  --   K 3.6* 3.7  --   BUN 21 19  --   CREATININE 1.00 1.12  --   ALT 27  --   --   HGB 14.8 13.8  --   LDLCALC  --   --  59  HDL  --   --  35*      Wt Readings from Last 3 Encounters:  11/23/13 209 lb (94.802 kg)  11/23/13 209 lb (94.802 kg)  11/22/13 210 lb 1.6 oz (95.3 kg)     Past Medical History  Diagnosis Date  . CAD (coronary artery disease), native coronary artery 10/30/2013    a. inf STEMI (8/15) >> DES to RCA and staged PCI with DES to CFX;  b. re-look  LHC (8/15):  Proximal LAD 40%, circumflex stent patent, mid RCA 30-40%, distal RCA stent patent, ostial PDA 50-60%, EF 55-60%, inferior HK  . Ischemic cardiomyopathy 10/30/2013    EF 45% akinesis on the entire inferior wall and severe hypokinesis of the basal and mid inferolateral walls   . Dyslipidemia 10/30/2013  . Acute MI 10/30/13    Current Outpatient Prescriptions  Medication Sig Dispense Refill  . acetaminophen (TYLENOL) 325 MG tablet Take 2 tablets (650 mg total) by mouth every 4 (four) hours as needed for headache or mild pain.      Marland Kitchen. aspirin EC 81 MG EC tablet Take 1 tablet (81 mg total) by mouth daily.      . prasugrel (EFFIENT) 10 MG TABS tablet Take 1 tablet (10 mg total) by mouth daily.  30 tablet  10  . rosuvastatin (CRESTOR) 20 MG tablet Take 0.5 tablets (10 mg total) by mouth daily.  30 tablet  1   No current facility-administered medications  for this visit.     Allergies:   Lipitor and Nitroglycerin   Social History:  The patient  reports that he has never smoked. He does not have any smokeless tobacco history on file. He reports that he drinks alcohol. He reports that he does not use illicit drugs.   Family History:  The patient's family history is not on file.   ROS:  Please see the history of present illness.       All other systems reviewed and negative.    PHYSICAL EXAM: VS:  BP 100/66  Pulse 57  Ht 5\' 11"  (1.803 m)  Wt 205 lb (92.987 kg)  BMI 28.60 kg/m2 Well nourished, well developed, in no acute distress HEENT: normal Neck:  no JVD Cardiac:  normal S1, S2;  RRR; no murmur Lungs:   clear to auscultation bilaterally, no wheezing, rhonchi or rales Abd: soft, nontender, no hepatomegaly Ext:  no edemaright wrist without hematoma or mass  Skin: warm and dry Neuro:  CNs  2-12 intact, no focal abnormalities noted  EKG:  Sinus bradycardia, HR 57, normal axis, inferior Q waves with inferolateral T-wave inversions     ASSESSMENT AND PLAN:  1. Coronary artery disease:  Doing well after recent Inf STEMI with DES to RCA and DES to CFX.  EF was improved by repeat LHC.  His BP and HR are too low to tolerate BB or ACEI.  Continue low dose statin.  Continue Cardiac Rehab. 2. Dyslipidemia - Plan: Lipid Profile, Hepatic function panel in 4 weeks.  Continue statin.  If he has trouble tolerating Crestor, consider referral to the Lipid Clinic.   Disposition:    FU with Dr. [name] in 2 mos.    Signed, Brynda Rim, MHS 12/27/2013 2:07 PM    [facility] [address] [place], [place], [place]  [address] Phone: [phone]; Fax: [phone]

## 2013-12-28 ENCOUNTER — Encounter (HOSPITAL_COMMUNITY)
Admission: RE | Admit: 2013-12-28 | Discharge: 2013-12-28 | Disposition: A | Discharge status: Home / Self Care | Payer: BC Managed Care – PPO | Source: Ambulatory Visit | Attending: Cardiovascular Disease | Admitting: Cardiovascular Disease

## 2013-12-28 DIAGNOSIS — Z5189 Encounter for other specified aftercare: Secondary | ICD-10-CM | POA: Diagnosis not present

## 2013-12-28 DIAGNOSIS — I252 Old myocardial infarction: Secondary | ICD-10-CM | POA: Insufficient documentation

## 2013-12-28 DIAGNOSIS — Z955 Presence of coronary angioplasty implant and graft: Secondary | ICD-10-CM | POA: Diagnosis not present

## 2013-12-31 ENCOUNTER — Encounter (HOSPITAL_COMMUNITY)
Admission: RE | Admit: 2013-12-31 | Discharge: 2013-12-31 | Disposition: A | Discharge status: Home / Self Care | Payer: BC Managed Care – PPO | Source: Ambulatory Visit | Attending: Cardiovascular Disease | Admitting: Cardiovascular Disease

## 2013-12-31 DIAGNOSIS — Z5189 Encounter for other specified aftercare: Secondary | ICD-10-CM | POA: Diagnosis not present

## 2014-01-02 ENCOUNTER — Encounter (HOSPITAL_COMMUNITY)
Admission: RE | Admit: 2014-01-02 | Discharge: 2014-01-02 | Disposition: A | Discharge status: Home / Self Care | Payer: BC Managed Care – PPO | Source: Ambulatory Visit | Attending: Cardiovascular Disease | Admitting: Cardiovascular Disease

## 2014-01-02 DIAGNOSIS — Z5189 Encounter for other specified aftercare: Secondary | ICD-10-CM | POA: Diagnosis not present

## 2014-01-04 ENCOUNTER — Encounter (HOSPITAL_COMMUNITY)
Admission: RE | Admit: 2014-01-04 | Discharge: 2014-01-04 | Disposition: A | Discharge status: Home / Self Care | Payer: BC Managed Care – PPO | Source: Ambulatory Visit | Attending: Cardiovascular Disease | Admitting: Cardiovascular Disease

## 2014-01-04 DIAGNOSIS — Z5189 Encounter for other specified aftercare: Secondary | ICD-10-CM | POA: Diagnosis not present

## 2014-01-07 ENCOUNTER — Encounter (HOSPITAL_COMMUNITY)
Admission: RE | Admit: 2014-01-07 | Discharge: 2014-01-07 | Disposition: A | Discharge status: Home / Self Care | Payer: BC Managed Care – PPO | Source: Ambulatory Visit | Attending: Cardiovascular Disease | Admitting: Cardiovascular Disease

## 2014-01-07 DIAGNOSIS — Z5189 Encounter for other specified aftercare: Secondary | ICD-10-CM | POA: Diagnosis not present

## 2014-01-09 ENCOUNTER — Encounter (HOSPITAL_COMMUNITY)
Admission: RE | Admit: 2014-01-09 | Discharge: 2014-01-09 | Disposition: A | Discharge status: Home / Self Care | Payer: BC Managed Care – PPO | Source: Ambulatory Visit | Attending: Cardiovascular Disease | Admitting: Cardiovascular Disease

## 2014-01-09 DIAGNOSIS — Z5189 Encounter for other specified aftercare: Secondary | ICD-10-CM | POA: Diagnosis not present

## 2014-01-11 ENCOUNTER — Encounter (HOSPITAL_COMMUNITY)
Admission: RE | Admit: 2014-01-11 | Discharge: 2014-01-11 | Disposition: A | Discharge status: Home / Self Care | Payer: BC Managed Care – PPO | Source: Ambulatory Visit | Attending: Cardiovascular Disease | Admitting: Cardiovascular Disease

## 2014-01-11 DIAGNOSIS — Z5189 Encounter for other specified aftercare: Secondary | ICD-10-CM | POA: Diagnosis not present

## 2014-01-14 ENCOUNTER — Encounter (HOSPITAL_COMMUNITY)
Admission: RE | Admit: 2014-01-14 | Discharge: 2014-01-14 | Disposition: A | Discharge status: Home / Self Care | Payer: BC Managed Care – PPO | Source: Ambulatory Visit | Attending: Cardiovascular Disease | Admitting: Cardiovascular Disease

## 2014-01-14 DIAGNOSIS — Z5189 Encounter for other specified aftercare: Secondary | ICD-10-CM | POA: Diagnosis not present

## 2014-01-16 ENCOUNTER — Encounter (HOSPITAL_COMMUNITY)
Admission: RE | Admit: 2014-01-16 | Discharge: 2014-01-16 | Disposition: A | Discharge status: Home / Self Care | Payer: BC Managed Care – PPO | Source: Ambulatory Visit | Attending: Cardiovascular Disease | Admitting: Cardiovascular Disease

## 2014-01-16 DIAGNOSIS — Z5189 Encounter for other specified aftercare: Secondary | ICD-10-CM | POA: Diagnosis not present

## 2014-01-18 ENCOUNTER — Encounter (HOSPITAL_COMMUNITY)
Admission: RE | Admit: 2014-01-18 | Discharge: 2014-01-18 | Disposition: A | Discharge status: Home / Self Care | Payer: BC Managed Care – PPO | Source: Ambulatory Visit | Attending: Cardiovascular Disease | Admitting: Cardiovascular Disease

## 2014-01-18 DIAGNOSIS — Z5189 Encounter for other specified aftercare: Secondary | ICD-10-CM | POA: Diagnosis not present

## 2014-01-21 ENCOUNTER — Encounter (HOSPITAL_COMMUNITY)
Admission: RE | Admit: 2014-01-21 | Discharge: 2014-01-21 | Disposition: A | Discharge status: Home / Self Care | Payer: BC Managed Care – PPO | Source: Ambulatory Visit | Attending: Cardiovascular Disease | Admitting: Cardiovascular Disease

## 2014-01-21 DIAGNOSIS — Z5189 Encounter for other specified aftercare: Secondary | ICD-10-CM | POA: Diagnosis not present

## 2014-01-23 ENCOUNTER — Encounter (HOSPITAL_COMMUNITY)
Admission: RE | Admit: 2014-01-23 | Discharge: 2014-01-23 | Disposition: A | Discharge status: Home / Self Care | Payer: BC Managed Care – PPO | Source: Ambulatory Visit | Attending: Cardiovascular Disease | Admitting: Cardiovascular Disease

## 2014-01-23 DIAGNOSIS — Z5189 Encounter for other specified aftercare: Secondary | ICD-10-CM | POA: Diagnosis not present

## 2014-01-24 ENCOUNTER — Other Ambulatory Visit (INDEPENDENT_AMBULATORY_CARE_PROVIDER_SITE_OTHER): Payer: BC Managed Care – PPO | Admitting: *Deleted

## 2014-01-24 DIAGNOSIS — E785 Hyperlipidemia, unspecified: Secondary | ICD-10-CM

## 2014-01-24 LAB — HEPATIC FUNCTION PANEL
ALT: 19 U/L (ref 0–53)
AST: 17 U/L (ref 0–37)
Albumin: 3.7 g/dL (ref 3.5–5.2)
Alkaline Phosphatase: 47 U/L (ref 39–117)
Bilirubin, Direct: 0 mg/dL (ref 0.0–0.3)
TOTAL PROTEIN: 7.2 g/dL (ref 6.0–8.3)
Total Bilirubin: 0.9 mg/dL (ref 0.2–1.2)

## 2014-01-24 LAB — LIPID PANEL
CHOL/HDL RATIO: 4
Cholesterol: 120 mg/dL (ref 0–200)
HDL: 32.4 mg/dL — ABNORMAL LOW (ref >39.00)
LDL Cholesterol: 65 mg/dL (ref 0–99)
NONHDL: 87.6
TRIGLYCERIDES: 111 mg/dL (ref 0.0–149.0)
VLDL: 22.2 mg/dL (ref 0.0–40.0)

## 2014-01-25 ENCOUNTER — Encounter (HOSPITAL_COMMUNITY)
Admission: RE | Admit: 2014-01-25 | Discharge: 2014-01-25 | Disposition: A | Discharge status: Home / Self Care | Payer: BC Managed Care – PPO | Source: Ambulatory Visit | Attending: Cardiovascular Disease | Admitting: Cardiovascular Disease

## 2014-01-25 DIAGNOSIS — Z5189 Encounter for other specified aftercare: Secondary | ICD-10-CM | POA: Diagnosis not present

## 2014-01-28 ENCOUNTER — Encounter (HOSPITAL_COMMUNITY)
Admission: RE | Admit: 2014-01-28 | Discharge: 2014-01-28 | Disposition: A | Discharge status: Home / Self Care | Payer: BC Managed Care – PPO | Source: Ambulatory Visit | Attending: Cardiovascular Disease | Admitting: Cardiovascular Disease

## 2014-01-28 DIAGNOSIS — Z5189 Encounter for other specified aftercare: Secondary | ICD-10-CM | POA: Diagnosis present

## 2014-01-28 DIAGNOSIS — Z955 Presence of coronary angioplasty implant and graft: Secondary | ICD-10-CM | POA: Diagnosis not present

## 2014-01-28 DIAGNOSIS — I252 Old myocardial infarction: Secondary | ICD-10-CM | POA: Diagnosis not present

## 2014-01-30 ENCOUNTER — Encounter (HOSPITAL_COMMUNITY): Payer: BC Managed Care – PPO

## 2014-01-30 DIAGNOSIS — Z5189 Encounter for other specified aftercare: Secondary | ICD-10-CM | POA: Diagnosis not present

## 2014-02-01 ENCOUNTER — Encounter (HOSPITAL_COMMUNITY)
Admission: RE | Admit: 2014-02-01 | Discharge: 2014-02-01 | Disposition: A | Discharge status: Home / Self Care | Payer: BC Managed Care – PPO | Source: Ambulatory Visit | Attending: Cardiovascular Disease | Admitting: Cardiovascular Disease

## 2014-02-01 DIAGNOSIS — Z5189 Encounter for other specified aftercare: Secondary | ICD-10-CM | POA: Diagnosis not present

## 2014-02-04 ENCOUNTER — Encounter (HOSPITAL_COMMUNITY)
Admission: RE | Admit: 2014-02-04 | Discharge: 2014-02-04 | Disposition: A | Discharge status: Home / Self Care | Payer: BC Managed Care – PPO | Source: Ambulatory Visit | Attending: Cardiovascular Disease | Admitting: Cardiovascular Disease

## 2014-02-04 DIAGNOSIS — Z5189 Encounter for other specified aftercare: Secondary | ICD-10-CM | POA: Diagnosis not present

## 2014-02-06 ENCOUNTER — Encounter (HOSPITAL_COMMUNITY)
Admission: RE | Admit: 2014-02-06 | Discharge: 2014-02-06 | Disposition: A | Discharge status: Home / Self Care | Payer: BC Managed Care – PPO | Source: Ambulatory Visit | Attending: Cardiovascular Disease | Admitting: Cardiovascular Disease

## 2014-02-06 DIAGNOSIS — Z5189 Encounter for other specified aftercare: Secondary | ICD-10-CM | POA: Diagnosis not present

## 2014-02-08 ENCOUNTER — Encounter (HOSPITAL_COMMUNITY): Payer: BC Managed Care – PPO

## 2014-02-11 ENCOUNTER — Encounter (HOSPITAL_COMMUNITY)
Admission: RE | Admit: 2014-02-11 | Discharge: 2014-02-11 | Disposition: A | Discharge status: Home / Self Care | Payer: BC Managed Care – PPO | Source: Ambulatory Visit | Attending: Cardiovascular Disease | Admitting: Cardiovascular Disease

## 2014-02-11 DIAGNOSIS — Z5189 Encounter for other specified aftercare: Secondary | ICD-10-CM | POA: Diagnosis not present

## 2014-02-13 ENCOUNTER — Encounter (HOSPITAL_COMMUNITY)
Admission: RE | Admit: 2014-02-13 | Discharge: 2014-02-13 | Disposition: A | Discharge status: Home / Self Care | Payer: BC Managed Care – PPO | Source: Ambulatory Visit | Attending: Cardiovascular Disease | Admitting: Cardiovascular Disease

## 2014-02-13 DIAGNOSIS — Z5189 Encounter for other specified aftercare: Secondary | ICD-10-CM | POA: Diagnosis not present

## 2014-02-15 ENCOUNTER — Encounter (HOSPITAL_COMMUNITY): Payer: BC Managed Care – PPO

## 2014-02-18 ENCOUNTER — Encounter (HOSPITAL_COMMUNITY)
Admission: RE | Admit: 2014-02-18 | Discharge: 2014-02-18 | Disposition: A | Discharge status: Home / Self Care | Payer: BC Managed Care – PPO | Source: Ambulatory Visit | Attending: Cardiovascular Disease | Admitting: Cardiovascular Disease

## 2014-02-18 DIAGNOSIS — Z5189 Encounter for other specified aftercare: Secondary | ICD-10-CM | POA: Diagnosis not present

## 2014-02-20 ENCOUNTER — Encounter (HOSPITAL_COMMUNITY)
Admission: RE | Admit: 2014-02-20 | Discharge: 2014-02-20 | Disposition: A | Discharge status: Home / Self Care | Payer: BC Managed Care – PPO | Source: Ambulatory Visit | Attending: Cardiovascular Disease | Admitting: Cardiovascular Disease

## 2014-02-20 DIAGNOSIS — Z5189 Encounter for other specified aftercare: Secondary | ICD-10-CM | POA: Diagnosis not present

## 2014-02-22 ENCOUNTER — Encounter (HOSPITAL_COMMUNITY): Payer: BC Managed Care – PPO

## 2014-02-25 ENCOUNTER — Encounter (HOSPITAL_COMMUNITY)
Admission: RE | Admit: 2014-02-25 | Discharge: 2014-02-25 | Disposition: A | Discharge status: Home / Self Care | Payer: BC Managed Care – PPO | Source: Ambulatory Visit | Attending: Cardiovascular Disease | Admitting: Cardiovascular Disease

## 2014-02-25 DIAGNOSIS — Z5189 Encounter for other specified aftercare: Secondary | ICD-10-CM | POA: Diagnosis not present

## 2014-02-27 ENCOUNTER — Encounter (HOSPITAL_COMMUNITY)
Admission: RE | Admit: 2014-02-27 | Discharge: 2014-02-27 | Disposition: A | Discharge status: Home / Self Care | Payer: BC Managed Care – PPO | Source: Ambulatory Visit | Attending: Cardiovascular Disease | Admitting: Cardiovascular Disease

## 2014-02-27 DIAGNOSIS — Z955 Presence of coronary angioplasty implant and graft: Secondary | ICD-10-CM | POA: Insufficient documentation

## 2014-02-27 DIAGNOSIS — Z5189 Encounter for other specified aftercare: Secondary | ICD-10-CM | POA: Insufficient documentation

## 2014-02-27 DIAGNOSIS — I252 Old myocardial infarction: Secondary | ICD-10-CM | POA: Insufficient documentation

## 2014-02-27 NOTE — Progress Notes (Signed)
Ron's wife is in the hospital he will not exercise today.

## 2014-03-01 ENCOUNTER — Encounter (HOSPITAL_COMMUNITY): Payer: BC Managed Care – PPO

## 2014-03-04 ENCOUNTER — Encounter (HOSPITAL_COMMUNITY): Payer: BC Managed Care – PPO

## 2014-03-04 ENCOUNTER — Encounter (HOSPITAL_COMMUNITY): Payer: Self-pay

## 2014-03-04 ENCOUNTER — Encounter (HOSPITAL_COMMUNITY)
Admission: RE | Admit: 2014-03-04 | Discharge: 2014-03-04 | Disposition: A | Discharge status: Home / Self Care | Payer: BC Managed Care – PPO | Source: Ambulatory Visit | Attending: Cardiovascular Disease | Admitting: Cardiovascular Disease

## 2014-03-04 DIAGNOSIS — Z5189 Encounter for other specified aftercare: Secondary | ICD-10-CM | POA: Diagnosis present

## 2014-03-04 DIAGNOSIS — I252 Old myocardial infarction: Secondary | ICD-10-CM | POA: Diagnosis not present

## 2014-03-04 DIAGNOSIS — Z955 Presence of coronary angioplasty implant and graft: Secondary | ICD-10-CM | POA: Diagnosis not present

## 2014-03-04 NOTE — Progress Notes (Signed)
Pt graduated from cardiac rehab program today with completion of 36 exercise sessions in Phase II. Pt maintained good attendance and progressed nicely during his participation in rehab as evidenced by increased MET level.   Medication list reconciled. Repeat  PHQ2 score-  0.  Pt has made significant lifestyle changes and should be commended for his success. Pt feels he has achieved his goals of improved healthy lifestyle and physical fitness during cardiac rehab.   Pt plans to continue exercising on his own at local sports center.

## 2014-03-04 NOTE — Progress Notes (Signed)
Background: The patient is followed for CAD. He initially presented with an inferior STEMI in August 2015 and was treated with primary PCI of the distal RCA with a drug-eluting stent. He underwent staged PCI of the left circumflex during his index hospitalization. Post-MI LV function was mildly depressed with an EF of 45%, but this normalized on follow-up. Re-look cardiac cath was done to evaluate recurrent symptoms and his stents were patent.   HPI:  53 year-old male presenting for follow-up evaluation. The patient is doing well from a cardiac perspective. He denies chest pain, chest pressure, shortness of breath, lightheadedness, or syncope. He's had no orthopnea or PND. He describes rare episodes of difficulty with sleep when he feels numbness in his toes and frequent urination. This occurs about every other week. He otherwise sleeps well and has no further complaints. He does have easy bruising but no significant bleeding on a combination of aspirin and Effient.  Studies:  Lipid Panel     Component Value Date/Time   CHOL 120 01/24/2014 0910   TRIG 111.0 01/24/2014 0910   HDL 32.40* 01/24/2014 0910   CHOLHDL 4 01/24/2014 0910   VLDL 22.2 01/24/2014 0910   LDLCALC 65 01/24/2014 0910    Outpatient Encounter Prescriptions as of 03/05/2014  Medication Sig  . acetaminophen (TYLENOL) 325 MG tablet Take 2 tablets (650 mg total) by mouth every 4 (four) hours as needed for headache or mild pain.  Marland Kitchen aspirin EC 81 MG EC tablet Take 1 tablet (81 mg total) by mouth daily.  . prasugrel (EFFIENT) 10 MG TABS tablet Take 1 tablet (10 mg total) by mouth daily.  . rosuvastatin (CRESTOR) 20 MG tablet Take 0.5 tablets (10 mg total) by mouth daily.    Allergies  Allergen Reactions  . Lipitor [Atorvastatin]     Diarrhea   . Nitroglycerin Other (See Comments)    (Sublingual) Arm numbness and weakness     Past Medical History  Diagnosis Date  . CAD (coronary artery disease), native coronary artery  10/30/2013    a. inf STEMI (8/15) >> DES to RCA and staged PCI with DES to CFX;  b. re-look  LHC (8/15):  Proximal LAD 40%, circumflex stent patent, mid RCA 30-40%, distal RCA stent patent, ostial PDA 50-60%, EF 55-60%, inferior HK  . Ischemic cardiomyopathy 10/30/2013    EF 45% akinesis on the entire inferior wall and severe hypokinesis of the basal and mid inferolateral walls   . Dyslipidemia 10/30/2013  . Acute MI 10/30/13    family history includes Hyperlipidemia in his father and mother.   ROS: Negative except as per HPI  BP 104/72 mmHg  Pulse 75  Ht 5\' 11"  (1.803 m)  Wt 200 lb 12.8 oz (91.082 kg)  BMI 28.02 kg/m2  PHYSICAL EXAM: Pt is alert and oriented, NAD HEENT: normal Neck: JVP - normal, carotids 2+= without bruits Lungs: CTA bilaterally CV: RRR without murmur or gallop Abd: soft, NT, Positive BS, no hepatomegaly Ext: no C/C/E, distal pulses intact and equal Skin: warm/dry no rash  ASSESSMENT AND PLAN: 1. 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 He is on a minimal medication regimen because of low blood pressure and inability to tolerate a beta blocker. He will continue dual antiplatelet therapy with aspirin and Effient through 12 months. I would like to see him back in August 2016 with an exercise treadmill study. Will likely discontinue Effient and keep him on long-term aspirin when he  is seen back next year. He understands to call if any problems arise. He has done an excellent job with lifestyle modification and weight loss.  2. Dyslipidemia:  Lipid Panel     Component Value Date/Time   CHOL 120 01/24/2014 0910   TRIG 111.0 01/24/2014 0910   HDL 32.40* 01/24/2014 0910   CHOLHDL 4 01/24/2014 0910   VLDL 22.2 01/24/2014 0910   LDLCALC 65 01/24/2014 0910   The patient is tolerating Crestor 10 mg daily. Lipids reviewed as above. Will follow-up with a lipid panel and LFTs prior to his return visit next summer.  Tonny BollmanMichael Etienne Mowers,  MD 03/05/2014 4:40 PM

## 2014-03-05 ENCOUNTER — Encounter: Payer: Self-pay | Admitting: Cardiovascular Disease

## 2014-03-05 ENCOUNTER — Ambulatory Visit (INDEPENDENT_AMBULATORY_CARE_PROVIDER_SITE_OTHER): Payer: BC Managed Care – PPO | Admitting: Cardiovascular Disease

## 2014-03-05 VITALS — BP 104/72 | HR 75 | Ht 71.0 in | Wt 200.8 lb

## 2014-03-05 DIAGNOSIS — E785 Hyperlipidemia, unspecified: Secondary | ICD-10-CM

## 2014-03-05 DIAGNOSIS — I2510 Atherosclerotic heart disease of native coronary artery without angina pectoris: Secondary | ICD-10-CM

## 2014-03-05 NOTE — Patient Instructions (Signed)
Your physician has requested that you have an exercise tolerance test in August 2016 with Dr Excell Seltzer.   Your physician recommends that you return for a FASTING LIPID and LIVER profile in August 2016 (1 week prior to stress test with Dr Excell Seltzer)  Your physician recommends that you continue on your current medications as directed. Please refer to the Current Medication list given to you today.

## 2014-03-07 ENCOUNTER — Encounter (HOSPITAL_COMMUNITY): Payer: Self-pay | Admitting: Cardiovascular Disease

## 2014-03-25 ENCOUNTER — Other Ambulatory Visit: Payer: Self-pay | Admitting: Physician Assistant

## 2014-03-26 ENCOUNTER — Other Ambulatory Visit: Payer: Self-pay

## 2014-03-26 MED ORDER — ROSUVASTATIN CALCIUM 20 MG PO TABS: 10.0000 mg | ORAL_TABLET | Freq: Every day | ORAL | Status: DC

## 2014-03-26 NOTE — Telephone Encounter (Signed)
[name], MD at 03/04/2014 9:06 PM  rosuvastatin (CRESTOR) 20 MG tablet  Take 0.5 tablets (10 mg total) by mouth daily   Pt wife called in needing refill for husband-stated they were given sample and 1 refill at pharmacy. Wife stated husband could not tolerate LIpitor at all but is fine on Crestor and that Dr. Copper said to take 1/2 of 20 MG tablet daily.

## 2014-04-23 ENCOUNTER — Telehealth: Payer: Self-pay | Admitting: Cardiovascular Disease

## 2014-04-23 DIAGNOSIS — R079 Chest pain, unspecified: Secondary | ICD-10-CM

## 2014-04-23 NOTE — Telephone Encounter (Signed)
Left message on machine for pt to contact the office.   

## 2014-04-23 NOTE — Telephone Encounter (Signed)
New Message  Pt wife called for pt. Requests a call back to discuss the pt not feeling normal. Heart doesn't heart like a heart attack but he went to urgent care. EKG came back normal. If he didn't feel good about the results to call the primary cardiologist. He believes " Something is just not right" He almost fell down the steps so he would rather be safe than sorry. Requests a callback to discuss. Please the pt back to discuss further.

## 2014-04-24 NOTE — Telephone Encounter (Signed)
I spoke with the Jimmy Goodwin and he has been having off and on Chest pain and nausea since Sunday.  The Jimmy Goodwin states that his symptoms on Sunday were similar to what he felt with his heart attack. The Jimmy Goodwin continued to have symptoms on Monday so he went to [facility] near [address].  EKG was performed and the Jimmy Goodwin was told this was normal. No labs were drawn. They advised the Jimmy Goodwin to contact our office if he had further symptoms.  The Jimmy Goodwin does have a copy of EKG. The Jimmy Goodwin has continued to have symptoms and describes pain in upper right and middle of chest. The Jimmy Goodwin said he does not feel well and this morning when he got up he thought he may have the flu. The Jimmy Goodwin denies any fever at this time.  The Jimmy Goodwin also states that he did do his cardiac work out yesterday and today and without any problems. The Jimmy Goodwin is unsure if this is related to muscle strain after he had to grab a handrail suddenly to keep from falling over the weekend.  The Jimmy Goodwin does note some discomfort when pressing on his chest. I made the Jimmy Goodwin aware that I will make Dr Excell Seltzer aware of his symptoms and contact the Jimmy Goodwin tonight in regard to follow-up (cell phone at [phone]).  The Jimmy Goodwin states that he has also been very anxious due to these symptoms and cannot differentiate between heart and muscle pain.

## 2014-04-24 NOTE — Telephone Encounter (Signed)
I spoke with the pt and he will have Troponin checked tomorrow and bring EKG into the office for review.

## 2014-04-24 NOTE — Telephone Encounter (Signed)
Advised the patient bring a copy of EKG and a the office tomorrow. Will draw a troponin at that time. Symptoms sound like they are most likely musculoskeletal.

## 2014-04-25 ENCOUNTER — Other Ambulatory Visit (INDEPENDENT_AMBULATORY_CARE_PROVIDER_SITE_OTHER): Payer: BC Managed Care – PPO | Admitting: *Deleted

## 2014-04-25 DIAGNOSIS — R079 Chest pain, unspecified: Secondary | ICD-10-CM

## 2014-04-25 LAB — TROPONIN I: Troponin I: <0.03 ng/mL (ref <0.30)

## 2014-04-25 NOTE — Telephone Encounter (Signed)
I spoke with the pt and made him aware that Troponin and EKG were normal. No further testing needed per Dr Excell Seltzer.

## 2014-04-26 ENCOUNTER — Telehealth: Payer: Self-pay | Admitting: Cardiology

## 2014-04-26 NOTE — Telephone Encounter (Signed)
Pt called asking about anxiety meds, his PCP will order.  I told him any should be fine.  Once she orders to notify our office.  He was agreeable.  He will call back if further questions or problems.  I offered him a PPI with his chjest pain but he did not want currently.

## 2014-07-29 ENCOUNTER — Encounter: Payer: Self-pay | Admitting: Cardiovascular Disease

## 2014-07-29 ENCOUNTER — Telehealth: Payer: Self-pay | Admitting: Cardiovascular Disease

## 2014-07-29 NOTE — Telephone Encounter (Signed)
New message        Pt has a question about cholesterol medication

## 2014-07-29 NOTE — Telephone Encounter (Signed)
Left message on machine for pt to contact the office.   

## 2014-07-30 NOTE — Telephone Encounter (Signed)
Left message on machine for pt to contact the office.   

## 2014-08-02 NOTE — Telephone Encounter (Signed)
Left message on machine for pt to contact the office.   

## 2014-08-05 NOTE — Telephone Encounter (Signed)
I spoke with the pt and he stopped Crestor 2 weeks ago due to insomnia and anxiety issues. Symptoms improved while off Crestor.  The pt states that he did seek evaluation from other providers on how to handle these issues without stopping Crestor but they did not have any other recommendations.  The pt has made "dramatic" changes in his diet and exercise and is down 35 lbs.  The pt would like to know if Dr Excell Seltzer would recommend an alternative medication at this time or recheck labs to see if lifestyle has improved numbers. The pt would also like Dr Excell Seltzer to know that he even tried taking Crestor a few times per week but still experienced symptoms.  I will forward this message to Dr Excell Seltzer for review. [phone] pt's cell)

## 2014-08-07 NOTE — Telephone Encounter (Signed)
I spoke with the pt and he will remain off of Crestor.  The pt will have his labs rechecked 08/28/14.

## 2014-08-07 NOTE — Telephone Encounter (Signed)
Would be fine to draw a lipid panel. Would wait until he is off of crestor at least 4-6 weeks before labs drawn.

## 2014-08-28 ENCOUNTER — Other Ambulatory Visit (INDEPENDENT_AMBULATORY_CARE_PROVIDER_SITE_OTHER): Payer: BC Managed Care – PPO | Admitting: *Deleted

## 2014-08-28 DIAGNOSIS — I2510 Atherosclerotic heart disease of native coronary artery without angina pectoris: Secondary | ICD-10-CM

## 2014-08-28 DIAGNOSIS — E785 Hyperlipidemia, unspecified: Secondary | ICD-10-CM

## 2014-08-28 LAB — HEPATIC FUNCTION PANEL
ALK PHOS: 49 U/L (ref 39–117)
ALT: 13 U/L (ref 0–53)
AST: 15 U/L (ref 0–37)
Albumin: 3.9 g/dL (ref 3.5–5.2)
BILIRUBIN DIRECT: 0.1 mg/dL (ref 0.0–0.3)
BILIRUBIN TOTAL: 0.4 mg/dL (ref 0.2–1.2)
TOTAL PROTEIN: 6.8 g/dL (ref 6.0–8.3)

## 2014-08-28 LAB — LIPID PANEL
CHOLESTEROL: 164 mg/dL (ref 0–200)
HDL: 36.1 mg/dL — ABNORMAL LOW (ref >39.00)
LDL Cholesterol: 110 mg/dL — ABNORMAL HIGH (ref 0–99)
NONHDL: 127.9
Total CHOL/HDL Ratio: 5
Triglycerides: 91 mg/dL (ref 0.0–149.0)
VLDL: 18.2 mg/dL (ref 0.0–40.0)

## 2014-09-11 ENCOUNTER — Telehealth: Payer: Self-pay | Admitting: Cardiovascular Disease

## 2014-09-11 DIAGNOSIS — Z9861 Coronary angioplasty status: Secondary | ICD-10-CM

## 2014-09-11 DIAGNOSIS — I255 Ischemic cardiomyopathy: Secondary | ICD-10-CM

## 2014-09-11 DIAGNOSIS — E785 Hyperlipidemia, unspecified: Secondary | ICD-10-CM

## 2014-09-11 DIAGNOSIS — I2510 Atherosclerotic heart disease of native coronary artery without angina pectoris: Secondary | ICD-10-CM

## 2014-09-11 MED ORDER — PRAVASTATIN SODIUM 40 MG PO TABS: 40.0000 mg | ORAL_TABLET | Freq: Every evening | ORAL | Status: DC

## 2014-09-11 NOTE — Telephone Encounter (Signed)
Patient notified of lab results and notation from Dr. Excell Seltzer - "Cholesterol is above goal now that he is off of Crestor. Could try pravastatin 40 mg daily or also could consider a non-statin drug such as Zetia. Would try pravastatin first and if similar side effects would change to Zetia 10 mg daily. Repeat labs 3-6 months. thx". Patient verbalized understanding and agreement to continue with current treatment plan. Mailed copy of lab results to patient. Rx called into [facility] in [place]. Repeat lab orders put in for mid-September.

## 2014-09-11 NOTE — Telephone Encounter (Signed)
New Message ° ° ° ° ° ° ° °Pt returning Lauren's phone call. °

## 2014-10-04 ENCOUNTER — Telehealth: Payer: Self-pay | Admitting: *Deleted

## 2014-10-04 MED ORDER — EZETIMIBE 10 MG PO TABS: 10.0000 mg | ORAL_TABLET | Freq: Every day | ORAL | Status: DC

## 2014-10-04 NOTE — Telephone Encounter (Signed)
Review of lab results from 09/09/14 to see Dr. Earmon Phoenix recommendations re: pravastatin and zetia. See additional phone note that has been routed to Dr. Excell Seltzer for further advisement.

## 2014-10-04 NOTE — Telephone Encounter (Signed)
Agreed thx 

## 2014-10-04 NOTE — Telephone Encounter (Signed)
Reviewed patient request with Ronie Spies, PA, as Dr. Excell Seltzer in Cath Lab all day today. Orders obtained to discontinue Pravastatin and Start Zetia, per treatment care plan from 09/02/2014 note by Dr. Excell Seltzer. Left detailed message on cell phone (per earlier request by patient) to inform him that he is to discontinue Pravastatin at this time and start taking Zetia 10 mg by mouth daily. He is to keep September appointment to have repeat lab work. Patient advised to call office back for any further questions, concerns or continued/new side effects.

## 2014-10-04 NOTE — Telephone Encounter (Signed)
Will route to Dr. Excell Seltzer for advisement discontinuing statin medication and starting on Zetia, per previous notes from Dr. Excell Seltzer - per patient request.

## 2014-10-04 NOTE — Telephone Encounter (Signed)
Patient calling in to request consideration to discontinue Pravastatin due to continued side effects of insomnia/anxiety. States Dr. Excell Seltzer previously mentioned an alternative medication.

## 2014-10-21 ENCOUNTER — Other Ambulatory Visit: Payer: BC Managed Care – PPO

## 2014-10-24 ENCOUNTER — Encounter: Payer: BC Managed Care – PPO | Admitting: Cardiovascular Disease

## 2014-10-24 ENCOUNTER — Other Ambulatory Visit: Payer: Self-pay | Admitting: Cardiovascular Disease

## 2014-10-24 ENCOUNTER — Ambulatory Visit: Payer: BC Managed Care – PPO | Admitting: Cardiovascular Disease

## 2014-10-24 ENCOUNTER — Encounter (INDEPENDENT_AMBULATORY_CARE_PROVIDER_SITE_OTHER): Payer: BC Managed Care – PPO

## 2014-10-24 DIAGNOSIS — E785 Hyperlipidemia, unspecified: Secondary | ICD-10-CM

## 2014-10-24 DIAGNOSIS — I2510 Atherosclerotic heart disease of native coronary artery without angina pectoris: Secondary | ICD-10-CM

## 2014-10-24 LAB — EXERCISE TOLERANCE TEST
CHL RATE OF PERCEIVED EXERTION: 16
CSEPED: 10 min
Estimated workload: 11.6 METS
Exercise duration (sec): 0 s
MPHR: 168 {beats}/min
Peak HR: 160 {beats}/min
Percent HR: 95 %
Rest HR: 64 {beats}/min

## 2014-10-24 NOTE — Patient Instructions (Signed)
Medication Instructions:  Your physician has recommended you make the following change in your medication:  1. STOP Effient  Labwork: No new orders.   Testing/Procedures: No new orders.   Follow-Up: Your physician wants you to follow-up in: 1 YEAR with Dr Excell Seltzer.  You will receive a reminder letter in the mail two months in advance. If you don't receive a letter, please call our office to schedule the follow-up appointment.  Any Other Special Instructions Will Be Listed Below (If Applicable).

## 2014-11-18 ENCOUNTER — Other Ambulatory Visit (INDEPENDENT_AMBULATORY_CARE_PROVIDER_SITE_OTHER): Payer: BC Managed Care – PPO

## 2014-11-18 DIAGNOSIS — E785 Hyperlipidemia, unspecified: Secondary | ICD-10-CM | POA: Diagnosis not present

## 2014-11-18 DIAGNOSIS — Z9861 Coronary angioplasty status: Secondary | ICD-10-CM

## 2014-11-18 DIAGNOSIS — I255 Ischemic cardiomyopathy: Secondary | ICD-10-CM

## 2014-11-18 DIAGNOSIS — I2510 Atherosclerotic heart disease of native coronary artery without angina pectoris: Secondary | ICD-10-CM

## 2014-11-18 LAB — LIPID PANEL
Cholesterol: 159 mg/dL (ref 0–200)
HDL: 37 mg/dL — ABNORMAL LOW (ref >39.00)
LDL Cholesterol: 109 mg/dL — ABNORMAL HIGH (ref 0–99)
NonHDL: 121.84
Total CHOL/HDL Ratio: 4
Triglycerides: 66 mg/dL (ref 0.0–149.0)
VLDL: 13.2 mg/dL (ref 0.0–40.0)

## 2014-11-18 LAB — HEPATIC FUNCTION PANEL
ALT: 17 U/L (ref 0–53)
AST: 16 U/L (ref 0–37)
Albumin: 4.1 g/dL (ref 3.5–5.2)
Alkaline Phosphatase: 48 U/L (ref 39–117)
Bilirubin, Direct: 0.1 mg/dL (ref 0.0–0.3)
Total Bilirubin: 0.5 mg/dL (ref 0.2–1.2)
Total Protein: 6.9 g/dL (ref 6.0–8.3)

## 2014-11-25 ENCOUNTER — Ambulatory Visit (INDEPENDENT_AMBULATORY_CARE_PROVIDER_SITE_OTHER): Payer: BC Managed Care – PPO | Admitting: Pharmacist

## 2014-11-25 ENCOUNTER — Other Ambulatory Visit: Payer: BC Managed Care – PPO

## 2014-11-25 DIAGNOSIS — E785 Hyperlipidemia, unspecified: Secondary | ICD-10-CM

## 2014-11-25 NOTE — Progress Notes (Signed)
Patient ID: Jimmy Goodwin                DOB: February 05, 2061, 54 yo                     MRN: [id number]     HPI: Jimmy Goodwin is a 54 y.o. male patient referred to lipid clinic by Dr. Excell Seltzer. PMH is significant for CAD, HLD, and STEMI in August 2015 with PCI of distal RCA and DES. Patient has a history of statin intolerances with Crestor, Lipitor, and pravastatin, all with varying degrees of anxiety and insomnia.  Patient states that he did not feel well and was very anxious when he started taking Lipitor after his STEMI. Discussed that this was likely multifactorial and not necessarily due to statins given his recent MI, other medications that were started at that time, and psychological aspect of having a heart attack. He reports that a few of his friends also had anxiety while taking statins, but also admits that he is in a better place mentally now than he was at the time of his heart attack. He reports that he still had anxiety and some insomnia with Crestor, which he tried for ~6 months. Most recently, patient was started on pravastatin  daily. He admits to only trying pravastatin for a few weeks and says that he tolerated it better, but thought that he was having some anxiety. He also reports that he has always taken statins in the AM. He thinks that he has more anxiety than insomnia when on statin therapy. Of note, patient has been tolerating Zetia well for the past 2 months and has not reported any issues with adherence or side effects; however, his LDL surprisingly did not decrease at all on therapy (expect 20-30% reduction).  Current Medications: Zetia  daily since 10/04/14 Intolerances: Lipitor  daily (anxiety, some insomnia) - 07/2014 for a few months, Crestor  and  daily (anxiety, some insomnia) - 10/2013 for 6 month duration, pravastatin  daily - tried for a few weeks and had mild insomnia and anxiety Risk Factors: ASCVD (CAD, STEMI in August 2015 with PCI), sex LDL goal:  70mg /dL  Diet: Patient made modifications to his diet after his heart attack in 2015. Cut out fried food from his diet (used to eat a few times a day) and now limits red meat to once weekly. For breakfast, he eats 1 egg and toast or [employer] bars. Lunch - usually a deli sandwich with baked chips. Dinner - chicken, [place], or fish, veggies. Snacks on fruit or granola bars. Eats ~50% meals at home and the other half at restaurants.  Exercise: Cardio at the gym 3-5 days per week for ~45 minutes. Uses the rowing machine, bike, walks, and runs. Completed 3 months of cardiac rehab after his heart attack last year.  Family History: Both parents with hyperlipidemia, no family history of cardiac disease.  Social History: No history of smoking or drug use. Patient does drink alcohol in moderation.  Labs: 10/2014: TC 159, TG 66, HDL 37, LDL 109 (Zetia  daily) 08/2014: TC 164, TG 91, HDL 36.1, LDL 110 (no therapy)   Past Medical History  Diagnosis Date  . CAD (coronary artery disease), native coronary artery 10/30/2013    a. inf STEMI (8/15) >> DES to RCA and staged PCI with DES to CFX;  b. re-look  LHC (8/15):  Proximal LAD 40%, circumflex stent patent, mid RCA 30-40%, distal RCA stent patent, ostial PDA  50-60%, EF 55-60%, inferior HK  . Ischemic cardiomyopathy 10/30/2013    EF 45% akinesis on the entire inferior wall and severe hypokinesis of the basal and mid inferolateral walls   . Dyslipidemia 10/30/2013  . Acute MI 10/30/13    Current Outpatient Prescriptions on File Prior to Visit  Medication Sig Dispense Refill  . acetaminophen (TYLENOL) 325 MG tablet Take 2 tablets (650 mg total) by mouth every 4 (four) hours as needed for headache or mild pain.    Marland Kitchen aspirin EC 81 MG EC tablet Take 1 tablet (81 mg total) by mouth daily.    Marland Kitchen ezetimibe (ZETIA) 10 MG tablet Take 1 tablet (10 mg total) by mouth daily. 90 tablet 3   No current facility-administered medications on file prior to  visit.    Allergies  Allergen Reactions  . Lipitor [Atorvastatin]     Diarrhea   . Nitroglycerin Other (See Comments)    (Sublingual) Arm numbness and weakness     Assessment/Plan: 1. Hyperlipidemia - patient with LDL currently 109mg /dL above goal 70mg /dL given history of CAD, STEMI in August 2015 s/p PCI and DES. Patient has intolerances to Lipitor 80mg  daily, Crestor 10mg  and 20mg  daily, and pravastatin 40mg  daily (all with varying degrees of anxiety and insomnia). He reports side effects were worse with the Lipitor, which he also took immediately following his MI. He trialed Crestor for ~6 months, but admits to taking pravastatin for only a few weeks. He thought that 40mg  was too high of a dose - discussed that doses are not interchangeable between statins and that he was only on a moderate intensity statin. Patient has made healthy lifestyle changes following his heart attack by cutting out fried foods and increasing his work outs to 3-5x/week. Discussed with patient that we have 2 options, rechallenging with another statin or pursuing PCSK9i therapy. Educated patient on pros and cons of each. Patient decided that at this time, he will trial a lower dose of pravastatin 20mg  and will try taking it at night to see if this has a different effect on his anxiety (he previously took all statins in the AM). Instructed patient to trial pravastatin for a month and to call us to f/u with tolerability. If patient is tolerating well, will continue pravastatin and f/u with lipid panel. If he continues to have side effects, will initiate paperwork to begin Praluent. He will continue his Zetia 10mg  daily as well. Patient is in agreement with plan, will await his follow up call.   Megan E. Supple, PharmD [facility] [address] [place], [place] [address] Phone: [phone]; Fax: [phone] 11/25/2014 5:04 PM

## 2014-11-25 NOTE — Patient Instructions (Signed)
Continue taking your Zetia 10mg  daily Start taking pravastatin 20mg  daily Call us in a month to let us know how you are doing with pravastatin (lipid clinic # [phone]). If you're doing well, we will schedule a lipid panel for you. If you aren't tolerating the pravastatin, we will begin paperwork for Praluent injection coverage.

## 2014-11-29 ENCOUNTER — Encounter: Payer: BC Managed Care – PPO | Admitting: Cardiovascular Disease

## 2014-12-03 ENCOUNTER — Telehealth: Payer: Self-pay | Admitting: Pharmacist

## 2014-12-03 NOTE — Telephone Encounter (Signed)
Patient called to follow up with pravastatin tolerability after recent lipid visit. Patient reports that he has been having side effects with the pravastatin (anxiety/insomnia) and would like to pursue Praluent. Will initiate paperwork.

## 2015-01-29 ENCOUNTER — Telehealth: Payer: Self-pay | Admitting: Pharmacist

## 2015-01-29 MED ORDER — ALIROCUMAB 75 MG/ML ~~LOC~~ SOPN: 1.0000 "pen " | PEN_INJECTOR | SUBCUTANEOUS | Status: DC

## 2015-01-29 NOTE — Telephone Encounter (Signed)
Spoke with patient - Praluent approved by insurance. Will send Rx to [facility] to set up home delivery of Praluent. Patient will call clinic once he receive his first shipment so that he can give his first injection in clinic. Will set up 2 month labwork at that time.

## 2015-02-07 ENCOUNTER — Telehealth: Payer: Self-pay | Admitting: Pharmacist

## 2015-02-07 NOTE — Telephone Encounter (Signed)
New Message  Pt returning call. Please call back and discuss.

## 2015-02-07 NOTE — Telephone Encounter (Addendum)
Patient called to say he had not heard from [facility]. Called his insurance to see what the delay was in setting up his Praluent shipment. Representative stated that rx was transferred to [facility]. Spoke with [employer] [place] - they had not informed insurance company of [place] approval. Pt should receive call in the next day or 2 to set up delivery for Praluent from [facility].

## 2015-02-10 ENCOUNTER — Other Ambulatory Visit: Payer: Self-pay | Admitting: Pharmacist

## 2015-02-10 MED ORDER — ALIROCUMAB 75 MG/ML ~~LOC~~ SOPN: 1.0000 "pen " | PEN_INJECTOR | SUBCUTANEOUS | Status: DC

## 2015-02-27 ENCOUNTER — Telehealth: Payer: Self-pay | Admitting: Pharmacist

## 2015-02-27 DIAGNOSIS — E785 Hyperlipidemia, unspecified: Secondary | ICD-10-CM

## 2015-02-27 NOTE — Telephone Encounter (Signed)
Pt came into clinic to give first Praluent injection. Injection self-administered in the R outer thigh with no difficulties. Scheduled lab work in 2 months.

## 2015-03-05 NOTE — Addendum Note (Signed)
Addended by: SUPPLE, MEGAN E on: 03/05/2015 01:11 PM   Modules accepted: Orders, Medications

## 2015-03-14 ENCOUNTER — Emergency Department: Payer: BC Managed Care – PPO

## 2015-03-14 ENCOUNTER — Encounter: Payer: Self-pay | Admitting: Emergency Medicine

## 2015-03-14 ENCOUNTER — Emergency Department
Admission: EM | Admit: 2015-03-14 | Discharge: 2015-03-14 | Disposition: A | Discharge status: Home / Self Care | Payer: BC Managed Care – PPO | Attending: Emergency Medicine | Admitting: Emergency Medicine

## 2015-03-14 DIAGNOSIS — R079 Chest pain, unspecified: Secondary | ICD-10-CM | POA: Insufficient documentation

## 2015-03-14 DIAGNOSIS — R001 Bradycardia, unspecified: Secondary | ICD-10-CM | POA: Diagnosis not present

## 2015-03-14 DIAGNOSIS — R51 Headache: Secondary | ICD-10-CM | POA: Insufficient documentation

## 2015-03-14 DIAGNOSIS — Z7982 Long term (current) use of aspirin: Secondary | ICD-10-CM | POA: Diagnosis not present

## 2015-03-14 DIAGNOSIS — R42 Dizziness and giddiness: Secondary | ICD-10-CM | POA: Insufficient documentation

## 2015-03-14 DIAGNOSIS — Z79899 Other long term (current) drug therapy: Secondary | ICD-10-CM | POA: Diagnosis not present

## 2015-03-14 LAB — CBC
HEMATOCRIT: 46.3 % (ref 40.0–52.0)
HEMOGLOBIN: 15.6 g/dL (ref 13.0–18.0)
MCH: 30.3 pg (ref 26.0–34.0)
MCHC: 33.6 g/dL (ref 32.0–36.0)
MCV: 90 fL (ref 80.0–100.0)
Platelets: 186 10*3/uL (ref 150–440)
RBC: 5.14 MIL/uL (ref 4.40–5.90)
RDW: 13.8 % (ref 11.5–14.5)
WBC: 7.1 10*3/uL (ref 3.8–10.6)

## 2015-03-14 LAB — TROPONIN I

## 2015-03-14 LAB — BASIC METABOLIC PANEL
ANION GAP: 8 (ref 5–15)
BUN: 18 mg/dL (ref 6–20)
CALCIUM: 9.5 mg/dL (ref 8.9–10.3)
CO2: 27 mmol/L (ref 22–32)
Chloride: 105 mmol/L (ref 101–111)
Creatinine, Ser: 1.22 mg/dL (ref 0.61–1.24)
GLUCOSE: 137 mg/dL — AB (ref 65–99)
POTASSIUM: 3.4 mmol/L — AB (ref 3.5–5.1)
SODIUM: 140 mmol/L (ref 135–145)

## 2015-03-14 MED ORDER — SODIUM CHLORIDE 0.9 % IV BOLUS (SEPSIS)
1000.0000 mL | Freq: Once | INTRAVENOUS | Status: AC
  Administered 2015-03-14: 1000 mL via INTRAVENOUS

## 2015-03-14 MED ORDER — ACETAMINOPHEN 325 MG PO TABS
650.0000 mg | ORAL_TABLET | Freq: Once | ORAL | Status: AC
  Administered 2015-03-14: 650 mg via ORAL
  Filled 2015-03-14: qty 2

## 2015-03-14 NOTE — ED Notes (Signed)
Pt. States previous heart attack in Aug. 2015.  Pt. States feeling nauseated throughout day today.  Pt. States chest pain began around. 1900 today.  Pt. Denies vomiting or SOB.  Pt. States "It might be anxiety."

## 2015-03-14 NOTE — ED Notes (Signed)
Pt reports that the reason he came today because he has been having intermittent episodes of dizziness for the last 2 days.

## 2015-03-14 NOTE — ED Notes (Signed)
Reviewed d/c instructions and follow-up care/appointments with pt. Pt verbalized understanding.

## 2015-03-14 NOTE — ED Provider Notes (Signed)
[facility] Emergency Department Provider Note   ____________________________________________  Time seen:  I have reviewed the triage vital signs and the triage nursing note.  HISTORY  Chief Complaint Chest Pain and Dizziness   Historian Patient, spouse  HPI Jimmy Goodwin is a 54 y.o. male with a history of coronary artery disease, who is here for evaluation of dizziness which is described as lightheadedness which has been intermittent for about 2 days. 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 He did become somewhat anxious and started feeling central chest pressure just before heading over to the emergency room for evaluation.  He is also somewhat anxious about his heart rate going down to 49. He states that his normal is in the 60s. He follows with [facility] cardiologist Dr. Excell Seltzer. Currently has no chest discomfort. He is still feeling somewhat lightheaded. He denies room spinning. Denies recent headaches. Denies confusion, weakness, numbness, slurred speech.    Past Medical History  Diagnosis Date  . CAD (coronary artery disease), native coronary artery 10/30/2013    a. inf STEMI (8/15) >> DES to RCA and staged PCI with DES to CFX;  b. re-look  LHC (8/15):  Proximal LAD 40%, circumflex stent patent, mid RCA 30-40%, distal RCA stent patent, ostial PDA 50-60%, EF 55-60%, inferior HK  . Ischemic cardiomyopathy 10/30/2013    EF 45% akinesis on the entire inferior wall and severe hypokinesis of the basal and mid inferolateral walls   . Dyslipidemia 10/30/2013  . Acute MI (HCC) 10/30/13    Patient Active Problem List   Diagnosis Date Noted  . Chest pain with moderate risk of acute coronary syndrome 11/23/2013  . CAD- S/P RCA DES 10/30/13 with staged CFX DES 11/01/13 11/23/2013  . Chest pain 11/23/2013  . Cardiomyopathy, ischemic 10/31/2013  . Dyslipidemia 10/31/2013  . 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 Tonsillectomy    . Stemi  10/30/2013    PCI distal RCA with 3.5x20 mm Promus DES  . Coronary angioplasty with stent placement  10/31/2013    Staged PCI proximal Circumflex with 3.5 x 12 mm Promus DES  . Left heart cath N/A 10/30/2013    Procedure: LEFT HEART CATH;  Surgeon: Micheline Chapman, MD;  Location: [facility] CATH LAB;  Service: Cardiovascular;  Laterality: N/A;  . Percutaneous coronary stent intervention (pci-s)  10/30/2013    Procedure: PERCUTANEOUS CORONARY STENT INTERVENTION (PCI-S);  Surgeon: Micheline Chapman, MD;  Location: [facility] CATH LAB;  Service: Cardiovascular;;  RCA  . Percutaneous coronary stent intervention (pci-s) N/A 10/31/2013    Procedure: PERCUTANEOUS CORONARY STENT INTERVENTION (PCI-S);  Surgeon: Kathleene Hazel, MD;  Location: [facility] CATH LAB;  Service: Cardiovascular;  Laterality: N/A;  . Left heart catheterization with coronary angiogram N/A 11/23/2013    Procedure: LEFT HEART CATHETERIZATION WITH CORONARY ANGIOGRAM;  Surgeon: Micheline Chapman, MD;  Location: [facility] CATH LAB;  Service: Cardiovascular;  Laterality: N/A;    Current Outpatient Rx  Name  Route  Sig  Dispense  Refill  . Alirocumab (PRALUENT) 75 MG/ML SOPN   Subcutaneous   Inject 1 pen into the skin every 14 (fourteen) days.   2 pen   11   . aspirin EC 81 MG EC tablet   Oral   Take 1 tablet (81 mg total) by mouth daily.         Marland Kitchen  ezetimibe (ZETIA) 10 MG tablet   Oral   Take 1 tablet (10 mg total) by mouth daily. Patient not taking: Reported on 03/14/2015   90 tablet   3     Allergies Lipitor and Nitroglycerin  Family History  Problem Relation Age of Onset  . Hyperlipidemia Mother   . Hyperlipidemia Father     Social History Social History  Substance Use Topics  . Smoking status: Never Smoker   . Smokeless tobacco: None  . Alcohol Use: Yes    Review of Systems  Constitutional: Negative for fever. Eyes: Negative for  visual changes. ENT: Negative for sore throat. Cardiovascular: Positive for chest pain. Respiratory: Negative for shortness of breath. Gastrointestinal: Negative for abdominal pain, vomiting and diarrhea. Genitourinary: Negative for dysuria. Musculoskeletal: Negative for back pain. Skin: Negative for rash. Neurological: Positive for headache, on exam here in the emergency department he has developed a mild global headache. 10 point Review of Systems otherwise negative ____________________________________________   PHYSICAL EXAM:  VITAL SIGNS: ED Triage Vitals  Enc Vitals Group     BP 03/14/15 1905 143/68 mmHg     Pulse Rate 03/14/15 1905 63     Resp 03/14/15 1905 20     Temp 03/14/15 1905 98.1 F (36.7 C)     Temp Source 03/14/15 1905 Oral     SpO2 03/14/15 1905 100 %     Weight 03/14/15 1908 190 lb (86.183 kg)     Height 03/14/15 1905  (1.803 m)     Head Cir --      Peak Flow --      Pain Score 03/14/15 1904 6     Pain Loc --      Pain Edu? --      Excl. in GC? --      Constitutional: Alert and oriented. Well appearing and in no distress. Eyes: Conjunctivae are normal. PERRL. Normal extraocular movements. Funduscopic exam normal bilaterally. ENT   Head: Normocephalic and atraumatic.   Nose: No congestion/rhinnorhea.   Mouth/Throat: Mucous membranes are moist.   Neck: No stridor. Cardiovascular/Chest: Bradycardic, regular.  No murmurs, rubs, or gallops. Respiratory: Normal respiratory effort without tachypnea nor retractions. Breath sounds are clear and equal bilaterally. No wheezes/rales/rhonchi. Gastrointestinal: Soft. No distention, no guarding, no rebound. Nontender   Genitourinary/rectal:Deferred Musculoskeletal: Nontender with normal range of motion in all extremities. No joint effusions.  No lower extremity tenderness.  No edema. Neurologic:  Normal speech and language. No gross or focal neurologic deficits are appreciated. Skin:  Skin is  warm, dry and intact. No rash noted. Psychiatric: Mood and affect are normal. Speech and behavior are normal. Patient exhibits appropriate insight and judgment.  ____________________________________________   EKG I, Governor Rooks, MD, the attending physician have personally viewed and interpreted all ECGs.  57 bpm. Sinus bradycardia. Narrow QRS. Normal axis. We be underlying baseline. Nonspecific T-wave.  Repeat EKG:  Sinus bradycardia 53 bpm. Narrow QRS. Normal axis. Normal ST and T-wave ____________________________________________  LABS (pertinent positives/negatives)  Basic metabolic panel significant for potassium 3.4 and otherwise without significant abnormality CBC within normal limits including hemoglobin 15.6 Troponin less than 0.03 Repeat troponin  less than 0.03  ____________________________________________  RADIOLOGY All Xrays were viewed by me. Imaging interpreted by Radiologist.  Chest x-ray one view portable: No active disease __________________________________________  PROCEDURES  Procedure(s) performed: None  Critical Care performed: None  ____________________________________________   ED COURSE / ASSESSMENT AND PLAN  CONSULTATIONS: Phone consultation with Dr. Jens Som, [facility] --  discussed presentation and bradycardia to 40s.  Does not recommend any additional emergency evaluation or hospitalization. Recommends follow-up as an outpatient.     Pertinent labs & imaging results that were available during my care of the patient were reviewed by me and considered in my medical decision making (see chart for details).   This patient is here for lightheadedness intermittently for 2 days, which seems somewhat worse today and was associated with nausea. Not necessarily vertiginous in nature, however no additional neurologic features to make me concerned for an intracranial emergency.  His symptoms actually sound orthostatic in nature and are made  worse with standing up and changing position. He does not report any recent risk factors for dehydration. His orthostatics here due to show increased heart rate 15 point upon standing. Blood pressure is stable. Patient was given 1 L normal saline here in the emergency department.    In terms of the bradycardia, he does have a baseline heart rate in the 50s to 60s per him, and he noted 49 at home, and he did have 1 episode where his heart rate dropped from the 50s into a low of 46 without symptoms here in the emergency department. I don't think his dizziness is likely due to bradycardia. I discussed this with the on-call cardiologist, who does not feel that the symptoms are likely coming from a heart rate in the mid to upper 40s.   He has not been having headaches nor neurologic symptoms. He was complaining of a mild global headache here in the emergency department.  I discussed with the patient consideration of obtaining head CT, however given there are no neurologic deficits, or high-risk features, we chose to hold off at this point in time. I have asked him to follow up with primary care physician to consider additional brain imaging such as MRI which would avoid the radiation. We also discussed return precautions with respect to dizziness as well as chest discomfort.  In the setting of reassuring EKG and physical exam, and laboratory evaluation, I suspect this chest pressure tonight was due to panic/anxiety associated with the symptoms. Given his prior history, however I have asked him to follow up with his cardiologist Dr. Excell Seltzer, he will call the office on Monday for an appointment on Monday or Tuesday.  Patient / Family / Caregiver informed of clinical course, medical decision-making process, and agree with plan.   I discussed return precautions, follow-up instructions, and discharged instructions with patient and/or family.  ___________________________________________   FINAL CLINICAL  IMPRESSION(S) / ED DIAGNOSES   Final diagnoses:  Dizziness  Nonspecific chest pain       Governor Rooks, MD 03/14/15 2336

## 2015-03-14 NOTE — Discharge Instructions (Signed)
You were evaluated for lightheadedness, and although no certain cause was found, however we discussed your exam and evaluation are reassuring emergency department. I suspect you may be having a mild dehydration that could be giving you orthostatic symptoms when you stand up and move around. Make sure you are staying well-hydrated.  Follow up with your primary care physician next week, for reevaluation of dizziness/lightheadedness. Call Dr. Excell Seltzer on Monday for follow-up on Monday or Tuesday due to symptoms of dizziness, chest discomfort, and low heart rate.   Dizziness Dizziness is a common problem. It makes you feel unsteady or lightheaded. You may feel like you are about to pass out (faint). Dizziness can lead to injury if you stumble or fall. Anyone can get dizzy, but dizziness is more common in older adults. This condition can be caused by a number of things, including:  Medicines.  Dehydration.  Illness. HOME CARE Following these instructions may help with your condition: Eating and Drinking  Drink enough fluid to keep your pee (urine) clear or pale yellow. This helps to keep you from getting dehydrated. Try to drink more clear fluids, such as water.  Do not drink alcohol.  Limit how much caffeine you drink or eat if told by your doctor.  Limit how much salt you drink or eat if told by your doctor. Activity  Avoid making quick movements.  When you stand up from sitting in a chair, steady yourself until you feel okay.  In the morning, first sit up on the side of the bed. When you feel okay, stand slowly while you hold onto something. Do this until you know that your balance is fine.  Move your legs often if you need to stand in one place for a long time. Tighten and relax your muscles in your legs while you are standing.  Do not drive or use heavy machinery if you feel dizzy.  Avoid bending down if you feel dizzy. Place items in your home so that they are easy for you to  reach without leaning over. Lifestyle  Do not use any tobacco products, including cigarettes, chewing tobacco, or electronic cigarettes. If you need help quitting, ask your doctor.  Try to lower your stress level, such as with yoga or meditation. Talk with your doctor if you need help. General Instructions  Watch your dizziness for any changes.  Take medicines only as told by your doctor. Talk with your doctor if you think that your dizziness is caused by a medicine that you are taking.  Tell a friend or a family member that you are feeling dizzy. If he or she notices any changes in your behavior, have this person call your doctor.  Keep all follow-up visits as told by your doctor. This is important. GET HELP IF:  Your dizziness does not go away.  Your dizziness or light-headedness gets worse.  You feel sick to your stomach (nauseous).  You have trouble hearing.  You have new symptoms.  You are unsteady on your feet or you feel like the room is spinning. GET HELP RIGHT AWAY IF:  You throw up (vomit) or have diarrhea and are unable to eat or drink anything.  You have trouble:  Talking.  Walking.  Swallowing.  Using your arms, hands, or legs.  You feel generally weak.  You are not thinking clearly or you have trouble forming sentences. It may take a friend or family member to notice this.  You have:  Chest pain.  Pain in  your belly (abdomen).  Shortness of breath.  Sweating.  Your vision changes.  You are bleeding.  You have a headache.  You have neck pain or a stiff neck.  You have a fever.   This information is not intended to replace advice given to you by your health care provider. Make sure you discuss any questions you have with your health care provider.   Document Released: 03/04/2011 Document Revised: 07/30/2014 Document Reviewed: 03/11/2014 Elsevier Interactive Patient Education 2016 Else[employer] Inc[employer]  Nonspecific Chest Pain  Chest pain can  be caused by many different conditions. There is always a chance that your pain could be related to something serious, such as a heart attack or a blood clot in your lungs. Chest pain can also be caused by conditions that are not life-threatening. If you have chest pain, it is very important to follow up with your health care provider. CAUSES  Chest pain can be caused by:  Heartburn.  Pneumonia or bronchitis.  Anxiety or stress.  Inflammation around your heart (pericarditis) or lung (pleuritis or pleurisy).  A blood clot in your lung.  A collapsed lung (pneumothorax). It can develop suddenly on its own (spontaneous pneumothorax) or from trauma to the chest.  Shingles infection (varicella-zoster virus).  Heart attack.  Damage to the bones, muscles, and cartilage that make up your chest wall. This can include:  Bruised bones due to injury.  Strained muscles or cartilage due to frequent or repeated coughing or overwork.  Fracture to one or more ribs.  Sore cartilage due to inflammation (costochondritis). RISK FACTORS  Risk factors for chest pain may include:  Activities that increase your risk for trauma or injury to your chest.  Respiratory infections or conditions that cause frequent coughing.  Medical conditions or overeating that can cause heartburn.  Heart disease or family history of heart disease.  Conditions or health behaviors that increase your risk of developing a blood clot.  Having had chicken pox (varicella zoster). SIGNS AND SYMPTOMS Chest pain can feel like:  Burning or tingling on the surface of your chest or deep in your chest.  Crushing, pressure, aching, or squeezing pain.  Dull or sharp pain that is worse when you move, cough, or take a deep breath.  Pain that is also felt in your back, neck, shoulder, or arm, or pain that spreads to any of these areas. Your chest pain may come and go, or it may stay constant. DIAGNOSIS Lab tests or other  studies may be needed to find the cause of your pain. Your health care provider may have you take a test called an ambulatory ECG (electrocardiogram). An ECG records your heartbeat patterns at the time the test is performed. You may also have other tests, such as:  Transthoracic echocardiogram (TTE). During echocardiography, sound waves are used to create a picture of all of the heart structures and to look at how blood flows through your heart.  Transesophageal echocardiogram (TEE).This is a more advanced imaging test that obtains images from inside your body. It allows your health care provider to see your heart in finer detail.  Cardiac monitoring. This allows your health care provider to monitor your heart rate and rhythm in real time.  Holter monitor. This is a portable device that records your heartbeat and can help to diagnose abnormal heartbeats. It allows your health care provider to track your heart activity for several days, if needed.  Stress tests. These can be done through exercise or by taking  medicine that makes your heart beat more quickly.  Blood tests.  Imaging tests. TREATMENT  Your treatment depends on what is causing your chest pain. Treatment may include:  Medicines. These may include:  Acid blockers for heartburn.  Anti-inflammatory medicine.  Pain medicine for inflammatory conditions.  Antibiotic medicine, if an infection is present.  Medicines to dissolve blood clots.  Medicines to treat coronary artery disease.  Supportive care for conditions that do not require medicines. This may include:  Resting.  Applying heat or cold packs to injured areas.  Limiting activities until pain decreases. HOME CARE INSTRUCTIONS  If you were prescribed an antibiotic medicine, finish it all even if you start to feel better.  Avoid any activities that bring on chest pain.  Do not use any tobacco products, including cigarettes, chewing tobacco, or electronic  cigarettes. If you need help quitting, ask your health care provider.  Do not drink alcohol.  Take medicines only as directed by your health care provider.  Keep all follow-up visits as directed by your health care provider. This is important. This includes any further testing if your chest pain does not go away.  If heartburn is the cause for your chest pain, you may be told to keep your head raised (elevated) while sleeping. This reduces the chance that acid will go from your stomach into your esophagus.  Make lifestyle changes as directed by your health care provider. These may include:  Getting regular exercise. Ask your health care provider to suggest some activities that are safe for you.  Eating a heart-healthy diet. A registered dietitian can help you to learn healthy eating options.  Maintaining a healthy weight.  Managing diabetes, if necessary.  Reducing stress. SEEK MEDICAL CARE IF:  Your chest pain does not go away after treatment.  You have a rash with blisters on your chest.  You have a fever. SEEK IMMEDIATE MEDICAL CARE IF:   Your chest pain is worse.  You have an increasing cough, or you cough up blood.  You have severe abdominal pain.  You have severe weakness.  You faint.  You have chills.  You have sudden, unexplained chest discomfort.  You have sudden, unexplained discomfort in your arms, back, neck, or jaw.  You have shortness of breath at any time.  You suddenly start to sweat, or your skin gets clammy.  You feel nauseous or you vomit.  You suddenly feel light-headed or dizzy.  Your heart begins to beat quickly, or it feels like it is skipping beats. These symptoms may represent a serious problem that is an emergency. Do not wait to see if the symptoms will go away. Get medical help right away. Call your local emergency services (911 in the [place] Do not drive yourself to the hospital.   This information is not intended to replace advice  given to you by your health care provider. Make sure you discuss any questions you have with your health care provider.   Document Released: 12/23/2004 Document Revised: 04/05/2014 Document Reviewed: 10/19/2013 Elsevier Interactive Patient Education [employer]

## 2015-03-14 NOTE — ED Notes (Signed)
Pt c/o left chest pressure that is similar to last MI.  Denies SHOB, [place]. Has been lightheaded.

## 2015-03-17 ENCOUNTER — Telehealth: Payer: Self-pay | Admitting: Cardiovascular Disease

## 2015-03-17 DIAGNOSIS — R001 Bradycardia, unspecified: Secondary | ICD-10-CM

## 2015-03-17 DIAGNOSIS — R42 Dizziness and giddiness: Secondary | ICD-10-CM

## 2015-03-17 NOTE — Telephone Encounter (Signed)
I spoke with the pt and he was evaluated at [facility] on 03/14/15 for dizziness.  The pt wears a fit bit and has noticed his pulse dips into the low 40s with dizziness.  The pt has also noticed when he exercises his heart rate does not respond.  Normally during exercise his pulse is 130-140 and now he cannot get it higher than 110.  The pt has been experiencing symptoms daily for 1 week.  The pt has also noticed a low BP and I advised him to drink more water and eat a salty snack when BP is low.  I will forward this information to Dr Excell Seltzer to review and make further recommendations.

## 2015-03-17 NOTE — Telephone Encounter (Signed)
Follow Up  Pt c/o BP issue:  Pt called req a call back to discuss is recent readings of his heart rate. Would like to go into further details with nurse

## 2015-03-18 NOTE — Telephone Encounter (Signed)
Agree with plans for monitor

## 2015-03-18 NOTE — Telephone Encounter (Signed)
I discussed this pt with Dr Excell Seltzer by phone and he recommends a 48 hour holter monitor be performed. Order placed.

## 2015-03-19 NOTE — Telephone Encounter (Signed)
Pt scheduled for 48 hour holter monitor placement on 03/20/15.

## 2015-03-20 ENCOUNTER — Ambulatory Visit (INDEPENDENT_AMBULATORY_CARE_PROVIDER_SITE_OTHER): Payer: BC Managed Care – PPO

## 2015-03-20 DIAGNOSIS — R42 Dizziness and giddiness: Secondary | ICD-10-CM | POA: Diagnosis not present

## 2015-03-20 DIAGNOSIS — R001 Bradycardia, unspecified: Secondary | ICD-10-CM

## 2015-05-07 ENCOUNTER — Other Ambulatory Visit (INDEPENDENT_AMBULATORY_CARE_PROVIDER_SITE_OTHER): Payer: BC Managed Care – PPO

## 2015-05-07 DIAGNOSIS — E785 Hyperlipidemia, unspecified: Secondary | ICD-10-CM

## 2015-05-07 LAB — LIPID PANEL
CHOL/HDL RATIO: 2.6 ratio (ref <=5.0)
Cholesterol: 103 mg/dL — ABNORMAL LOW (ref 125–200)
HDL: 39 mg/dL — AB (ref >=40)
LDL CALC: 54 mg/dL (ref <130)
Triglycerides: 52 mg/dL (ref <150)
VLDL: 10 mg/dL (ref <30)

## 2015-05-07 LAB — LDL CHOLESTEROL, DIRECT: LDL DIRECT: 62 mg/dL (ref <130)

## 2015-05-08 ENCOUNTER — Other Ambulatory Visit: Payer: Self-pay | Admitting: Pharmacist

## 2015-05-08 DIAGNOSIS — E785 Hyperlipidemia, unspecified: Secondary | ICD-10-CM

## 2015-10-03 ENCOUNTER — Encounter: Payer: Self-pay | Admitting: Cardiovascular Disease

## 2015-10-03 NOTE — Telephone Encounter (Signed)
New Message:   He says wants to ask you about an annual checkup.

## 2015-10-03 NOTE — Telephone Encounter (Signed)
This encounter was created in error - please disregard.

## 2015-10-17 ENCOUNTER — Encounter: Payer: Self-pay | Admitting: Cardiovascular Disease

## 2015-10-17 NOTE — Telephone Encounter (Signed)
This encounter was created in error - please disregard.

## 2015-10-17 NOTE — Telephone Encounter (Signed)
New Message  Pt stated- is returning call from RN- concerning his f/u. Please call back and discuss.

## 2015-10-21 ENCOUNTER — Other Ambulatory Visit (INDEPENDENT_AMBULATORY_CARE_PROVIDER_SITE_OTHER): Payer: BC Managed Care – PPO | Admitting: *Deleted

## 2015-10-21 ENCOUNTER — Encounter: Payer: Self-pay | Admitting: Cardiovascular Disease

## 2015-10-21 ENCOUNTER — Ambulatory Visit (INDEPENDENT_AMBULATORY_CARE_PROVIDER_SITE_OTHER): Payer: BC Managed Care – PPO | Admitting: Cardiovascular Disease

## 2015-10-21 VITALS — BP 118/68 | HR 46 | Ht 71.0 in | Wt 190.8 lb

## 2015-10-21 DIAGNOSIS — E785 Hyperlipidemia, unspecified: Secondary | ICD-10-CM | POA: Diagnosis not present

## 2015-10-21 DIAGNOSIS — Z9861 Coronary angioplasty status: Secondary | ICD-10-CM

## 2015-10-21 DIAGNOSIS — I2510 Atherosclerotic heart disease of native coronary artery without angina pectoris: Secondary | ICD-10-CM

## 2015-10-21 DIAGNOSIS — I255 Ischemic cardiomyopathy: Secondary | ICD-10-CM

## 2015-10-21 LAB — LIPID PANEL
CHOL/HDL RATIO: 2.9 ratio (ref <=5.0)
CHOLESTEROL: 124 mg/dL — AB (ref 125–200)
HDL: 43 mg/dL (ref >=40)
LDL CALC: 66 mg/dL (ref <130)
TRIGLYCERIDES: 77 mg/dL (ref <150)
VLDL: 15 mg/dL (ref <30)

## 2015-10-21 LAB — HEPATIC FUNCTION PANEL
ALBUMIN: 4.2 g/dL (ref 3.6–5.1)
ALT: 15 U/L (ref 9–46)
AST: 16 U/L (ref 10–35)
Alkaline Phosphatase: 47 U/L (ref 40–115)
BILIRUBIN INDIRECT: 0.6 mg/dL (ref 0.2–1.2)
Bilirubin, Direct: 0.1 mg/dL (ref <=0.2)
TOTAL PROTEIN: 6.8 g/dL (ref 6.1–8.1)
Total Bilirubin: 0.7 mg/dL (ref 0.2–1.2)

## 2015-10-21 NOTE — Patient Instructions (Signed)
Medication Instructions:  Your physician recommends that you continue on your current medications as directed. Please refer to the Current Medication list given to you today.   Labwork: none  Testing/Procedures: none  Follow-Up: Your physician wants you to follow-up in: 1 year with Dr Cooper. (July 2018) You will receive a reminder letter in the mail two months in advance. If you don't receive a letter, please call our office to schedule the follow-up appointment.        If you need a refill on your cardiac medications before your next appointment, please call your pharmacy.   

## 2015-10-23 NOTE — Progress Notes (Signed)
Cardiology Office Note Date:  10/23/2015   ID:  Jimmy Goodwin, DOB 1961-03-13, MRN [id number]  PCP:  Sid Falcon, MD  Cardiologist:  [name], MD    No chief complaint on file.    History of Present Illness: Jimmy Goodwin is a 55 y.o. male who presents for follow-up of CAD. He initially presented with an inferior STEMI in 2015 and was treated with primary PCI of the distal RCA with a drug-eluting stent. He underwent staged PCI of the left circumflex during his index hospitalization. Post-MI LV function was mildly depressed with an EF of 45%, but this normalized on follow-up. Re-look cardiac cath was done to evaluate recurrent symptoms and his stents were patent.   The patient is doing well. Today, he denies symptoms of palpitations, chest pain, shortness of breath, orthopnea, PND, lower extremity edema, dizziness, or syncope. 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  Past Medical History:  Diagnosis Date  . Acute MI (HCC) 10/30/13  . CAD (coronary artery disease), native coronary artery 10/30/2013   a. inf STEMI (8/15) >> DES to RCA and staged PCI with DES to CFX;  b. re-look  LHC (8/15):  Proximal LAD 40%, circumflex stent patent, mid RCA 30-40%, distal RCA stent patent, ostial PDA 50-60%, EF 55-60%, inferior HK  . Dyslipidemia 10/30/2013  . Ischemic cardiomyopathy 10/30/2013   EF 45% akinesis on the entire inferior wall and severe hypokinesis of the basal and mid inferolateral walls     Past Surgical History:  Procedure Laterality Date  . CORONARY ANGIOPLASTY WITH STENT PLACEMENT  10/31/2013   Staged PCI proximal Circumflex with 3.5 x 12 mm Promus DES  . LEFT HEART CATH N/A 10/30/2013   Procedure: LEFT HEART CATH;  Surgeon: Micheline Chapman, MD;  Location: [facility] CATH LAB;  Service: Cardiovascular;  Laterality: N/A;  . LEFT HEART CATHETERIZATION WITH CORONARY ANGIOGRAM N/A 11/23/2013   Procedure: LEFT HEART CATHETERIZATION WITH  CORONARY ANGIOGRAM;  Surgeon: Micheline Chapman, MD;  Location: [facility] CATH LAB;  Service: Cardiovascular;  Laterality: N/A;  . PERCUTANEOUS CORONARY STENT INTERVENTION (PCI-S)  10/30/2013   Procedure: PERCUTANEOUS CORONARY STENT INTERVENTION (PCI-S);  Surgeon: Micheline Chapman, MD;  Location: [facility] CATH LAB;  Service: Cardiovascular;;  RCA  . PERCUTANEOUS CORONARY STENT INTERVENTION (PCI-S) N/A 10/31/2013   Procedure: PERCUTANEOUS CORONARY STENT INTERVENTION (PCI-S);  Surgeon: Kathleene Hazel, MD;  Location: [facility] CATH LAB;  Service: Cardiovascular;  Laterality: N/A;  . STEMI  10/30/2013   PCI distal RCA with 3.5x20 mm Promus DES  . TONSILLECTOMY      Current Outpatient Prescriptions  Medication Sig Dispense Refill  . Alirocumab (PRALUENT) 75 MG/ML SOPN Inject 1 pen into the skin every 14 (fourteen) days. 2 pen 11  . aspirin EC 81 MG EC tablet Take 1 tablet (81 mg total) by mouth daily.     No current facility-administered medications for this visit.     Allergies:   Lipitor [atorvastatin] and Nitroglycerin   Social History:  The patient  reports that he has never smoked. He has never used smokeless tobacco. He reports that he drinks alcohol. He reports that he does not use drugs.   Family History:  The patient's  family history includes Hyperlipidemia in his father and mother.    ROS:  Please see the history of present illness.  Otherwise, review of systems is positive for gum/gingival problems.  All other systems are reviewed and negative.  PHYSICAL EXAM: VS:  BP 118/68 (BP Location: Right Arm, Patient Position: Sitting, Cuff Size: Normal)   Pulse (!) 46   Ht 5\' 11"  (1.803 m)   Wt 190 lb 12.8 oz (86.5 kg)   SpO2 98%   BMI 26.61 kg/m  , BMI Body mass index is 26.61 kg/m. GEN: Well nourished, well developed, in no acute distress  HEENT: normal  Neck: no JVD, no masses. No carotid bruits Cardiac: bradycardic and regular without murmur or gallop                Respiratory:  clear to  auscultation bilaterally, normal work of breathing GI: soft, nontender, nondistended, + BS MS: no deformity or atrophy  Ext: no pretibial edema, pedal pulses 2+= bilaterally Skin: warm and dry, no rash Neuro:  Strength and sensation are intact Psych: euthymic mood, full affect  EKG:  EKG is ordered today. The ekg ordered today shows Sinus brady 46 bpm, otherwise within normal limits  Recent Labs: 03/14/2015: BUN 18; Creatinine, Ser 1.22; Hemoglobin 15.6; Platelets 186; Potassium 3.4; Sodium 140 10/21/2015: ALT 15   Lipid Panel     Component Value Date/Time   CHOL 124 (L) 10/21/2015 0809   TRIG 77 10/21/2015 0809   HDL 43 10/21/2015 0809   CHOLHDL 2.9 10/21/2015 0809   VLDL 15 10/21/2015 0809   LDLCALC 66 10/21/2015 0809   LDLDIRECT 62 05/07/2015 0828      Wt Readings from Last 3 Encounters:  10/21/15 190 lb 12.8 oz (86.5 kg)  03/14/15 190 lb (86.2 kg)  03/05/14 200 lb 12.8 oz (91.1 kg)     Cardiac Studies Reviewed: Holter 03-20-2015: Study Highlights   Sinus rhythm, rare PVC's and PACs, no pathologic pauses or sustained arrhythmia. Benign Holter result    GXT 10-24-2014: Study Highlights    There was no ST segment deviation noted during stress.    Stress Findings   ECG Baseline ECG is normal..    Stress Findings The patient exercised following the Bruce protocol.  The patient reported fatigue during the stress test. The patient experienced no angina during the stress test.   The test was stopped because the patient complained of fatigue.   Blood pressure and heart rate demonstrated a normal response to exercise. Overall, the patient's exercise capacity was excellent.   85% of maximum heart rate was achieved after 8.5 minutes.  [facility] Treadmill Score: low risk The patient's response to exercise was adequate for diagnosis. Excellent exercise tolerance. No angina, arrhythmia, or significant ST change with exertion.    Response to Stress There was no ST segment  deviation noted during stress.  Arrhythmias during stress: occasional PACs.  Arrhythmias during recovery: none.  Arrhythmias were not significant.  ECG was interpretable and conclusive.    Stress Measurements   Baseline Vitals  Rest HR 64 bpm    Rest BP 129/77 mmHg    Exercise Time  Exercise duration (min) 10 min    Exercise duration (sec) 0 sec    Peak Stress Vitals  Peak HR 160 bpm    Peak BP 185/85 mmHg    Exercise Data  MPHR 168 bpm    Percent HR 95 %    RPE 16     Estimated workload 11.6 METS         ASSESSMENT AND PLAN: 1.  CAD, native vessel: no angina. Current medications reviewed and will be continued without change. GXT reviewed from 2016 - normal study.  2. Hyperlipidemia: statin-intolerant. Treated with  Praluent. Labs reviewed. Followed in [facility]  3. Bradycardia: marked sinus brady, asymptomatic. Not on any AV nodal blocking agents.   Current medicines are reviewed with the patient today.  The patient does not have concerns regarding medicines.  Labs/ tests ordered today include:  Orders Placed This Encounter  Procedures  . EKG 12-Lead   Disposition:   FU one year  Signed, [name], MD  10/23/2015 12:07 AM    [facility] [address] [place], [place], [place]  [address] Phone: [phone]; Fax: [phone]

## 2015-11-03 ENCOUNTER — Other Ambulatory Visit: Payer: BC Managed Care – PPO

## 2016-01-09 ENCOUNTER — Other Ambulatory Visit: Payer: Self-pay | Admitting: Cardiovascular Disease

## 2016-01-27 IMAGING — CR DG CHEST 1V PORT
1 series · 1 of 1 positions shown · non-contrast
Comparison: None.

CLINICAL DATA: Chest pain.  Code STEMI.

EXAM:
PORTABLE CHEST - 1 VIEW

[view not recorded]
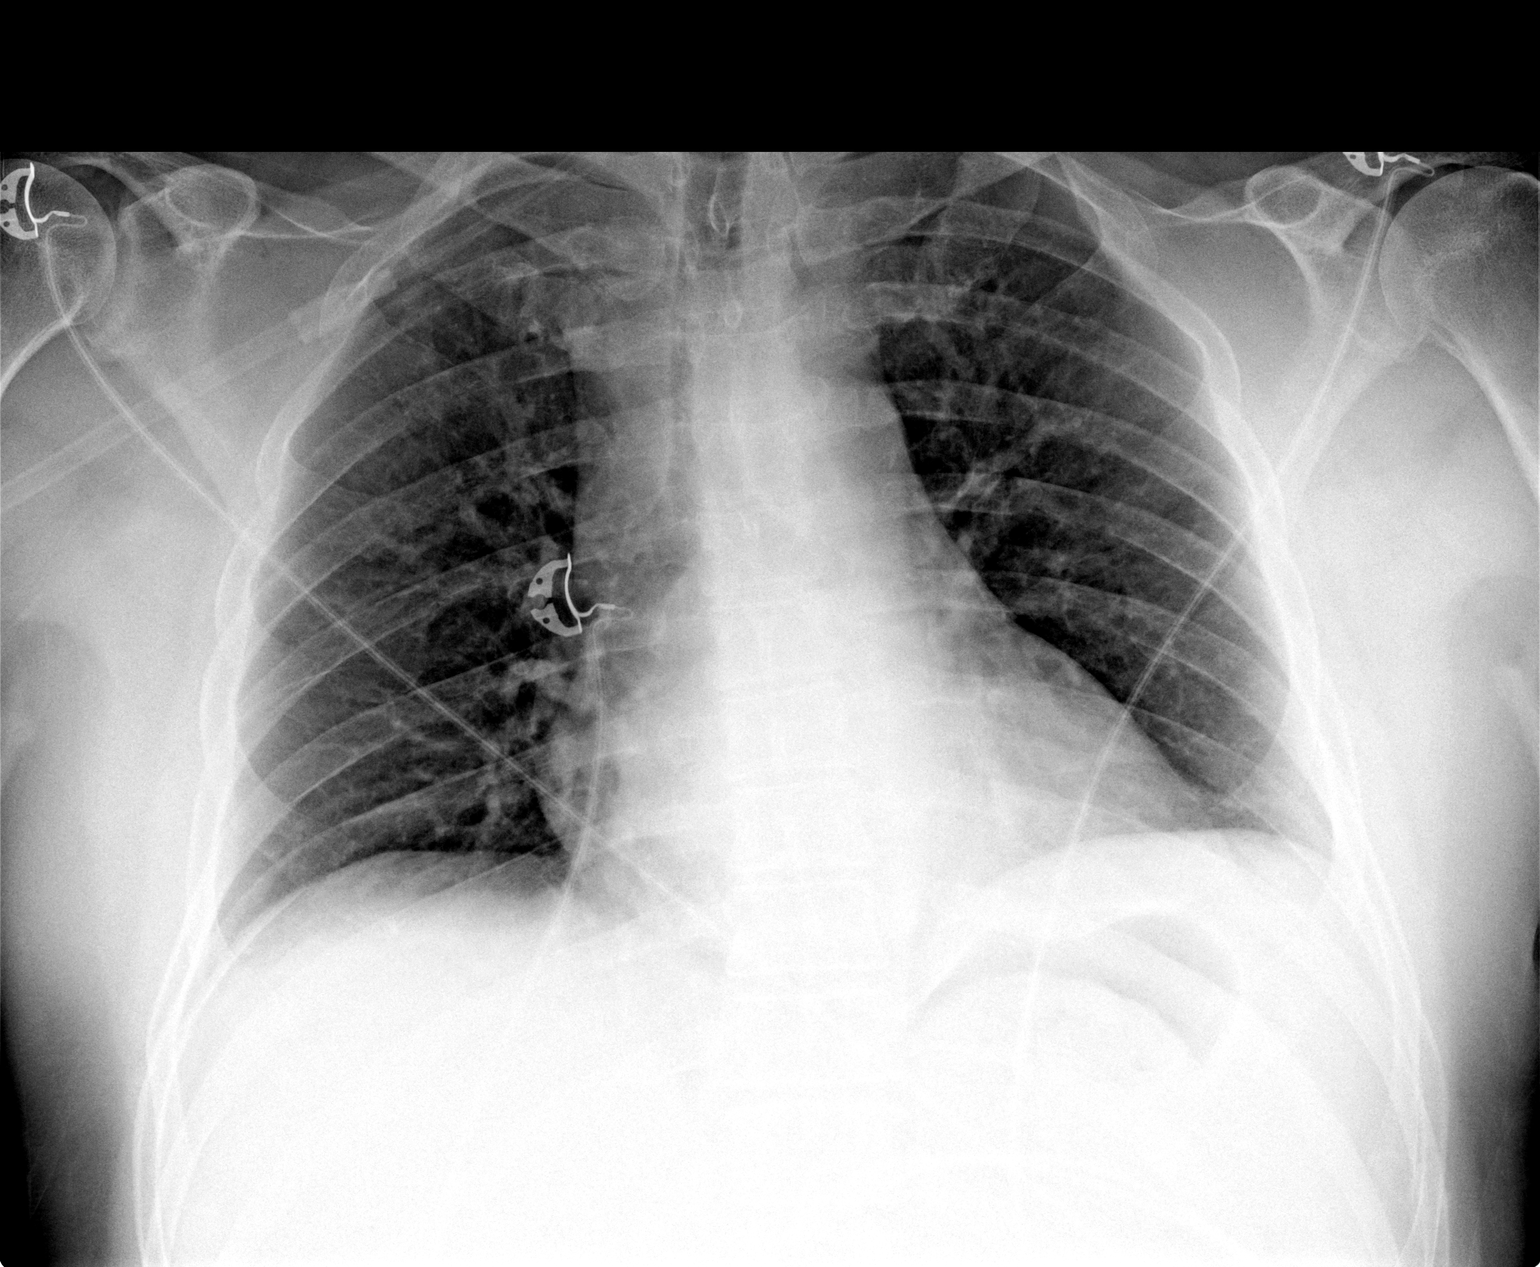

[1 of 1 positions shown; findings below may reference images not displayed]

FINDINGS: Normal heart size and mediastinal contours. No acute infiltrate or
edema. No effusion or pneumothorax. No acute osseous findings.
IMPRESSION: No active disease.

## 2016-03-08 ENCOUNTER — Telehealth: Payer: Self-pay | Admitting: Pharmacist

## 2016-03-08 ENCOUNTER — Other Ambulatory Visit: Payer: Self-pay | Admitting: Pharmacist

## 2016-03-08 MED ORDER — EVOLOCUMAB 140 MG/ML ~~LOC~~ SOAJ: 1.0000 "pen " | SUBCUTANEOUS | 11 refills | Status: DC

## 2016-03-08 NOTE — Telephone Encounter (Signed)
PA submitted for Praluent, [facility] only has Repatha on formulary. Pt aware his PCSK9i is being switched to Repatha for formulary reasons. Rx has been sent to [facility] specialty pharmacy.

## 2016-04-12 ENCOUNTER — Telehealth: Payer: Self-pay | Admitting: Pharmacist

## 2016-04-12 MED ORDER — ALIROCUMAB 75 MG/ML ~~LOC~~ SOPN: 1.0000 "pen " | PEN_INJECTOR | SUBCUTANEOUS | 11 refills | Status: DC

## 2016-04-12 NOTE — Telephone Encounter (Addendum)
Pt called stating that he wants to switch from Repatha back to Praluent. He was originally on [employer], then had to switch to Repatha because of his [employer] FEP insurance and filling with [facility] - both of which covered Repatha in 2017. States he wants to switch back to Praluent now because he had side effects with Repatha injections. Reports that after a single injection, he experienced flu like symptoms and muscle aches. He went to his PCP who told him that he did not have the flu. His symptoms resolved within the next week. He did not experience any myalgias or flu like symptoms with Praluent. Will submit PA to switch patient back to Praluent.

## 2016-05-07 NOTE — Telephone Encounter (Signed)
Received notification that appeals for Praluent was approved. Confirmed with pt that he received shipment of Praluent.

## 2016-07-30 ENCOUNTER — Telehealth: Payer: Self-pay | Admitting: Cardiovascular Disease

## 2016-07-30 DIAGNOSIS — E785 Hyperlipidemia, unspecified: Secondary | ICD-10-CM

## 2016-07-30 NOTE — Telephone Encounter (Signed)
New message    Please call pt with appt to have labs done before his yearly check up with Dr Excell Seltzer , no orders in computer

## 2016-07-30 NOTE — Telephone Encounter (Signed)
Left message on voicemail that the pt can have fasting labs drawn one week prior to appointment with Dr Excell Seltzer. Appointment has been scheduled and orders are placed.

## 2016-10-25 ENCOUNTER — Other Ambulatory Visit: Payer: BC Managed Care – PPO

## 2016-10-26 ENCOUNTER — Other Ambulatory Visit: Payer: BC Managed Care – PPO

## 2016-10-26 DIAGNOSIS — E785 Hyperlipidemia, unspecified: Secondary | ICD-10-CM

## 2016-10-26 LAB — HEPATIC FUNCTION PANEL
ALBUMIN: 4.3 g/dL (ref 3.5–5.5)
ALK PHOS: 51 IU/L (ref 39–117)
ALT: 13 IU/L (ref 0–44)
AST: 16 IU/L (ref 0–40)
Bilirubin Total: 0.6 mg/dL (ref 0.0–1.2)
Bilirubin, Direct: 0.16 mg/dL (ref 0.00–0.40)
TOTAL PROTEIN: 6.8 g/dL (ref 6.0–8.5)

## 2016-10-26 LAB — LIPID PANEL
CHOL/HDL RATIO: 2.5 ratio (ref 0.0–5.0)
Cholesterol, Total: 114 mg/dL (ref 100–199)
HDL: 46 mg/dL (ref >39)
LDL Calculated: 50 mg/dL (ref 0–99)
Triglycerides: 89 mg/dL (ref 0–149)
VLDL CHOLESTEROL CAL: 18 mg/dL (ref 5–40)

## 2016-11-01 ENCOUNTER — Ambulatory Visit (INDEPENDENT_AMBULATORY_CARE_PROVIDER_SITE_OTHER): Payer: BC Managed Care – PPO | Admitting: Cardiovascular Disease

## 2016-11-01 ENCOUNTER — Encounter: Payer: Self-pay | Admitting: Cardiovascular Disease

## 2016-11-01 VITALS — BP 104/60 | HR 50 | Ht 71.0 in | Wt 192.2 lb

## 2016-11-01 DIAGNOSIS — E785 Hyperlipidemia, unspecified: Secondary | ICD-10-CM | POA: Diagnosis not present

## 2016-11-01 DIAGNOSIS — I255 Ischemic cardiomyopathy: Secondary | ICD-10-CM | POA: Diagnosis not present

## 2016-11-01 DIAGNOSIS — I2510 Atherosclerotic heart disease of native coronary artery without angina pectoris: Secondary | ICD-10-CM

## 2016-11-01 NOTE — Progress Notes (Signed)
Cardiology Office Note Date:  11/01/2016   ID:  Jimmy Goodwin, DOB [birth year], MRN [id number]  PCP:  Loyal Jacobson, MD  Cardiologist:  [name], MD    Chief Complaint  Patient presents with  . Follow-up    cardiomyopathy,ischemic     History of Present Illness: Jimmy Goodwin is a 56 y.o. male who presents for follow-up of CAD. He initially presented with an inferior STEMI in 2015 and was treated with primary PCI of the distal RCA with a drug-eluting stent. He underwent staged PCI of the left circumflex during his index hospitalization. Post-MI LV function was mildly depressed with an EF of 45%, but this normalized on follow-up. Re-look cardiac cath was done to evaluate recurrent symptoms and his stents were patent.   The patient is here alone today. He is doing very well from a cardiac perspective and he has no specific complaints. Today, he denies symptoms of palpitations, chest pain, shortness of breath, orthopnea, PND, lower extremity edema, dizziness, or syncope. 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    Past Medical History:  Diagnosis Date  . Acute MI (HCC) 10/30/13  . CAD (coronary artery disease), native coronary artery 10/30/2013   a. inf STEMI (8/15) >> DES to RCA and staged PCI with DES to CFX;  b. re-look  LHC (8/15):  Proximal LAD 40%, circumflex stent patent, mid RCA 30-40%, distal RCA stent patent, ostial PDA 50-60%, EF 55-60%, inferior HK  . Dyslipidemia 10/30/2013  . Ischemic cardiomyopathy 10/30/2013   EF 45% akinesis on the entire inferior wall and severe hypokinesis of the basal and mid inferolateral walls     Past Surgical History:  Procedure Laterality Date  . CORONARY ANGIOPLASTY WITH STENT PLACEMENT  10/31/2013   Staged PCI proximal Circumflex with 3.5 x 12 mm Promus DES  . LEFT HEART CATH N/A 10/30/2013   Procedure: LEFT HEART CATH;  Surgeon:  Micheline Chapman, MD;  Location: [facility] CATH LAB;  Service: Cardiovascular;  Laterality: N/A;  . LEFT HEART CATHETERIZATION WITH CORONARY ANGIOGRAM N/A 11/23/2013   Procedure: LEFT HEART CATHETERIZATION WITH CORONARY ANGIOGRAM;  Surgeon: Micheline Chapman, MD;  Location: [facility] CATH LAB;  Service: Cardiovascular;  Laterality: N/A;  . PERCUTANEOUS CORONARY STENT INTERVENTION (PCI-S)  10/30/2013   Procedure: PERCUTANEOUS CORONARY STENT INTERVENTION (PCI-S);  Surgeon: Micheline Chapman, MD;  Location: [facility] CATH LAB;  Service: Cardiovascular;;  RCA  . PERCUTANEOUS CORONARY STENT INTERVENTION (PCI-S) N/A 10/31/2013   Procedure: PERCUTANEOUS CORONARY STENT INTERVENTION (PCI-S);  Surgeon: Kathleene Hazel, MD;  Location: [facility] CATH LAB;  Service: Cardiovascular;  Laterality: N/A;  . STEMI  10/30/2013   PCI distal RCA with 3.5x20 mm Promus DES  . TONSILLECTOMY      Current Outpatient Prescriptions  Medication Sig Dispense Refill  . Alirocumab (PRALUENT) 75 MG/ML SOPN Inject 1 pen into the skin every 14 (fourteen) days. 2 pen 11  . aspirin EC 81 MG EC tablet Take 1 tablet (81 mg total) by mouth daily.    Marland Kitchen dexamethasone (DECADRON) 0.5 MG/5ML solution Take 5 mLs by mouth as directed. Swish and spit 5 ml once daily as needed for mouth    . fluocinonide gel (LIDEX) 0.05 % Apply 1 application topically daily as needed. mouth     No current facility-administered medications for this visit.     Allergies:   Lipitor [atorvastatin] and Nitroglycerin  Social History:  The patient  reports that he has never smoked. He has never used smokeless tobacco. He reports that he drinks alcohol. He reports that he does not use drugs.   Family History:  The patient's  family history includes Hyperlipidemia in his father and mother.    ROS:  Please see the history of present illness.  All other systems are reviewed and negative.    PHYSICAL EXAM: VS:  BP 104/60   Pulse (!) 50   Ht 5\' 11"  (1.803 m)   Wt 192 lb 3.2 oz (87.2 kg)    BMI 26.81 kg/m  , BMI Body mass index is 26.81 kg/m. GEN: Well nourished, well developed, in no acute distress  HEENT: normal  Neck: no JVD, no masses. No carotid bruits Cardiac: bradycardic and regular without murmur or gallop                Respiratory:  clear to auscultation bilaterally, normal work of breathing GI: soft, nontender, nondistended, + BS MS: no deformity or atrophy  Ext: no pretibial edema, pedal pulses 2+= bilaterally Skin: warm and dry, no rash Neuro:  Strength and sensation are intact Psych: euthymic mood, full affect  EKG:  EKG is ordered today. The ekg ordered today shows sinus bradycardia 51 bpm, rightward axis, otherwise normal EKG  Recent Labs: 10/26/2016: ALT 13   Lipid Panel     Component Value Date/Time   CHOL 114 10/26/2016 1000   TRIG 89 10/26/2016 1000   HDL 46 10/26/2016 1000   CHOLHDL 2.5 10/26/2016 1000   CHOLHDL 2.9 10/21/2015 0809   VLDL 15 10/21/2015 0809   LDLCALC 50 10/26/2016 1000   LDLDIRECT 62 05/07/2015 0828      Wt Readings from Last 3 Encounters:  11/01/16 192 lb 3.2 oz (87.2 kg)  10/21/15 190 lb 12.8 oz (86.5 kg)  03/14/15 190 lb (86.2 kg)     ASSESSMENT AND PLAN: 1.  Coronary artery disease, native vessel, without angina: Patient continues on aspirin 81 mg daily. He is asymptomatic.  2. Hyperlipidemia: The patient is statin intolerant. He is treated with Praluent and his lipids are excellent. Recent labs demonstrated total cholesterol of 114, HDL 46, LDL 50, triglycerides 89.  3. Bradycardia: Patient is asymptomatic. No change from previous as he runs a heart rate around 50 bpm chronically.  Current medicines are reviewed with the patient today.  The patient does not have concerns regarding medicines.  Labs/ tests ordered today include:   Orders Placed This Encounter  Procedures  . Lipid panel  . Hepatic function panel  . EKG 12-Lead    Disposition:   FU one year  Signed, [name], MD  11/01/2016 1:36  PM    [facility] [address] [place], [place], [place]  [address] Phone: [phone]; Fax: [phone]

## 2016-11-01 NOTE — Patient Instructions (Signed)
Medication Instructions:  Your physician recommends that you continue on your current medications as directed. Please refer to the Current Medication list given to you today.  Labwork: Your physician recommends that you return for a FASTING LIPID and LIVER in 1 YEAR--nothing to eat or drink after midnight, lab opens at 7:30 AM.   Testing/Procedures: No new orders.   Follow-Up: Your physician wants you to follow-up in: 1 YEAR with Dr Cooper. You will receive a reminder letter in the mail two months in advance. If you don't receive a letter, please call our office to schedule the follow-up appointment.   Any Other Special Instructions Will Be Listed Below (If Applicable).     If you need a refill on your cardiac medications before your next appointment, please call your pharmacy.   

## 2017-03-03 ENCOUNTER — Other Ambulatory Visit: Payer: Self-pay | Admitting: Cardiovascular Disease

## 2017-04-28 ENCOUNTER — Telehealth: Payer: Self-pay | Admitting: Pharmacist

## 2017-04-28 NOTE — Telephone Encounter (Signed)
Praluent reauthorization approved through 04/27/18.

## 2017-06-10 IMAGING — CR DG CHEST 1V PORT
1 series · 1 of 1 positions shown · non-contrast
Comparison: None.

CLINICAL DATA: Left-sided chest pain

EXAM:
PORTABLE CHEST - 1 VIEW

[portable]
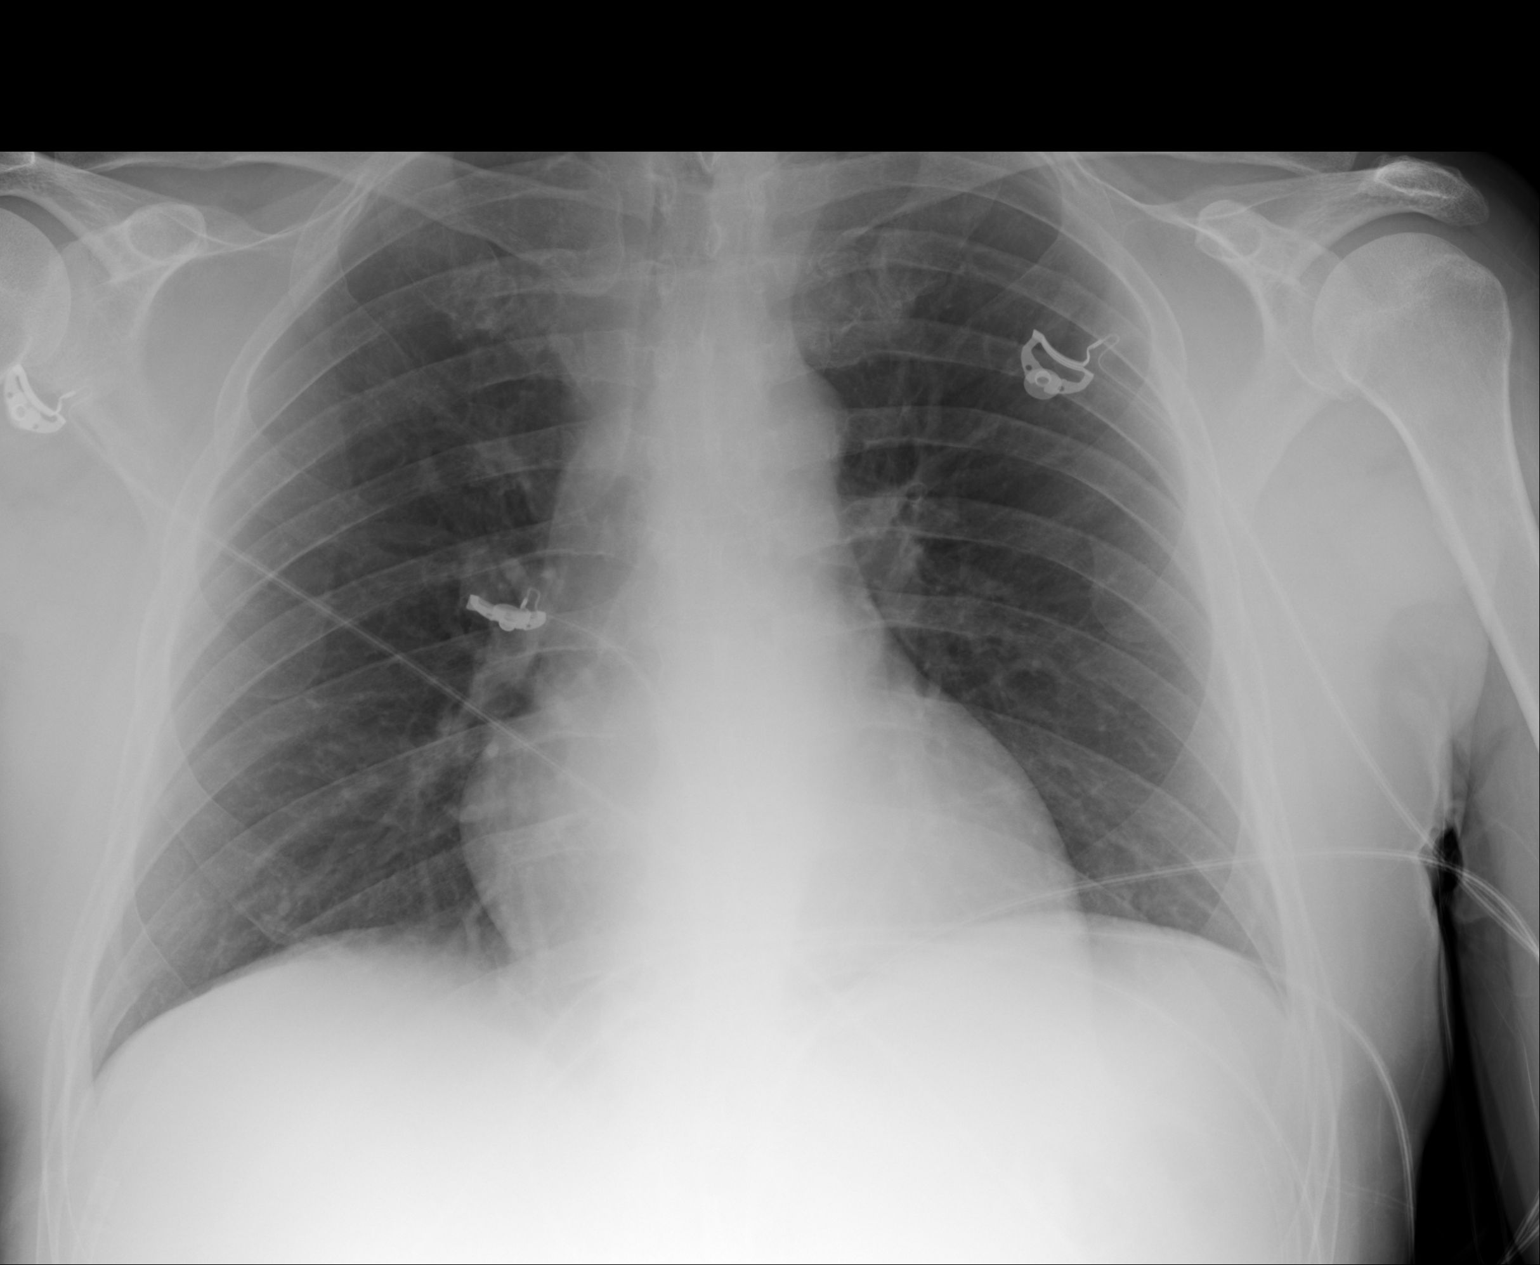

[1 of 1 positions shown; findings below may reference images not displayed]

FINDINGS: The heart size and mediastinal contours are within normal limits.
Both lungs are clear. The visualized skeletal structures are
unremarkable.
IMPRESSION: No active disease.

## 2017-08-18 ENCOUNTER — Telehealth: Payer: Self-pay | Admitting: Cardiovascular Disease

## 2017-08-18 NOTE — Telephone Encounter (Signed)
Pt returning call to katy-pls call [phone]

## 2017-08-18 NOTE — Telephone Encounter (Signed)
New Message   Pt needs labs 1 week prior to his appt on 8/6 but orders are too old. Please call

## 2017-08-18 NOTE — Telephone Encounter (Signed)
Left message to call back to schedule fasting lab appointment.

## 2017-08-18 NOTE — Telephone Encounter (Signed)
Left message to call back  

## 2017-08-18 NOTE — Telephone Encounter (Signed)
New Message:      Pt is calling and requesting to speak with a RN

## 2017-08-18 NOTE — Telephone Encounter (Signed)
Left message for patient that all he needs is to schedule a fasting lab appointment prior to Dr. Earmon Phoenix next visit and to call and arrange.

## 2017-08-19 NOTE — Telephone Encounter (Signed)
Informed pt why Orpha Bur was calling.   Pt would prefer to follow up w/ Dr. Excell Seltzer, not a PA. Pt understands I will forward to [facility] to address w/ pt next week, as Dr. Excell Seltzer does not have any openings prior to Oct/Nov. Orpha Bur - I did not schedule lab since I wasn't sure if current OV would be re-scheduled)

## 2017-08-19 NOTE — Telephone Encounter (Signed)
Pt calling   Stating he is returning call from [place] from yesterday. Please call pt

## 2017-08-23 NOTE — Telephone Encounter (Signed)
Patient requests to only see Dr .Excell Seltzer.  Cancelled APP appointment and scheduled with Dr. Excell Seltzer 8/26. Patient will come for fasting labs 8/22. He was grateful for call and agrees with treatment plan.

## 2017-11-01 ENCOUNTER — Ambulatory Visit: Payer: BC Managed Care – PPO | Admitting: Physician Assistant

## 2017-11-17 ENCOUNTER — Other Ambulatory Visit: Payer: BC Managed Care – PPO | Admitting: *Deleted

## 2017-11-17 DIAGNOSIS — E785 Hyperlipidemia, unspecified: Secondary | ICD-10-CM

## 2017-11-17 DIAGNOSIS — I2510 Atherosclerotic heart disease of native coronary artery without angina pectoris: Secondary | ICD-10-CM

## 2017-11-17 DIAGNOSIS — I255 Ischemic cardiomyopathy: Secondary | ICD-10-CM

## 2017-11-17 LAB — LIPID PANEL
CHOLESTEROL TOTAL: 108 mg/dL (ref 100–199)
Chol/HDL Ratio: 2.6 ratio (ref 0.0–5.0)
HDL: 42 mg/dL (ref >39)
LDL Calculated: 50 mg/dL (ref 0–99)
TRIGLYCERIDES: 82 mg/dL (ref 0–149)
VLDL Cholesterol Cal: 16 mg/dL (ref 5–40)

## 2017-11-17 LAB — HEPATIC FUNCTION PANEL
ALBUMIN: 4.2 g/dL (ref 3.5–5.5)
ALK PHOS: 47 IU/L (ref 39–117)
ALT: 12 IU/L (ref 0–44)
AST: 12 IU/L (ref 0–40)
BILIRUBIN TOTAL: 0.3 mg/dL (ref 0.0–1.2)
Bilirubin, Direct: 0.14 mg/dL (ref 0.00–0.40)
Total Protein: 6.4 g/dL (ref 6.0–8.5)

## 2017-11-21 ENCOUNTER — Ambulatory Visit: Payer: BC Managed Care – PPO | Admitting: Cardiovascular Disease

## 2017-11-21 ENCOUNTER — Encounter: Payer: Self-pay | Admitting: Cardiovascular Disease

## 2017-11-21 VITALS — BP 110/60 | HR 53 | Ht 71.0 in | Wt 187.8 lb

## 2017-11-21 DIAGNOSIS — E782 Mixed hyperlipidemia: Secondary | ICD-10-CM

## 2017-11-21 DIAGNOSIS — I2510 Atherosclerotic heart disease of native coronary artery without angina pectoris: Secondary | ICD-10-CM | POA: Diagnosis not present

## 2017-11-21 NOTE — Progress Notes (Signed)
Cardiology Office Note Date:  11/21/2017   ID:  Olukayode Nuzum, DOB 11-Apr-1960, MRN [id number]  PCP:  Loyal Jacobson, MD  Cardiologist:  [name], MD    Chief Complaint  Patient presents with  . Follow-up    CAD     History of Present Illness: Jimmy Goodwin is a 57 y.o. male who presents for follow-up ofCAD. He initially presented with an inferior STEMI in 2015 and was treated with primary PCI of the distal RCA with a drug-eluting stent. He underwent staged PCI of the left circumflex during his index hospitalization. Post-MI LV function was mildly depressed with an EF of 45%, but this normalized on follow-up. Re-look cardiac cath was done to evaluate recurrent symptoms and his stents were patent.   The patient is statin intolerant and he is treated with Praluent.  The patient is here alone today.  He is doing well.  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  He exercises regularly with no exertional symptoms.  He specifically denies chest pain, chest pressure, shortness of breath, lightheadedness, or heart palpitations.   Past Medical History:  Diagnosis Date  . Acute MI (HCC) 10/30/13  . CAD (coronary artery disease), native coronary artery 10/30/2013   a. inf STEMI (8/15) >> DES to RCA and staged PCI with DES to CFX;  b. re-look  LHC (8/15):  Proximal LAD 40%, circumflex stent patent, mid RCA 30-40%, distal RCA stent patent, ostial PDA 50-60%, EF 55-60%, inferior HK  . Dyslipidemia 10/30/2013  . Ischemic cardiomyopathy 10/30/2013   EF 45% akinesis on the entire inferior wall and severe hypokinesis of the basal and mid inferolateral walls     Past Surgical History:  Procedure Laterality Date  . CORONARY ANGIOPLASTY WITH STENT PLACEMENT  10/31/2013   Staged PCI proximal Circumflex with 3.5 x 12 mm Promus DES  . LEFT HEART CATH N/A 10/30/2013   Procedure: LEFT HEART CATH;  Surgeon: Micheline Chapman, MD;  Location: [facility] CATH LAB;   Service: Cardiovascular;  Laterality: N/A;  . LEFT HEART CATHETERIZATION WITH CORONARY ANGIOGRAM N/A 11/23/2013   Procedure: LEFT HEART CATHETERIZATION WITH CORONARY ANGIOGRAM;  Surgeon: Micheline Chapman, MD;  Location: [facility] CATH LAB;  Service: Cardiovascular;  Laterality: N/A;  . PERCUTANEOUS CORONARY STENT INTERVENTION (PCI-S)  10/30/2013   Procedure: PERCUTANEOUS CORONARY STENT INTERVENTION (PCI-S);  Surgeon: Micheline Chapman, MD;  Location: [facility] CATH LAB;  Service: Cardiovascular;;  RCA  . PERCUTANEOUS CORONARY STENT INTERVENTION (PCI-S) N/A 10/31/2013   Procedure: PERCUTANEOUS CORONARY STENT INTERVENTION (PCI-S);  Surgeon: Kathleene Hazel, MD;  Location: [facility] CATH LAB;  Service: Cardiovascular;  Laterality: N/A;  . STEMI  10/30/2013   PCI distal RCA with 3.5x20 mm Promus DES  . TONSILLECTOMY      Current Outpatient Medications  Medication Sig Dispense Refill  . aspirin EC 81 MG EC tablet Take 1 tablet (81 mg total) by mouth daily.    Marland Kitchen dexamethasone (DECADRON) 0.5 MG/5ML solution Take 5 mLs by mouth as directed. Swish and spit 5 ml once daily as needed for mouth    . fluocinonide gel (LIDEX) 0.05 % Apply 1 application topically daily as needed. mouth    . PRALUENT 75 MG/ML SOPN INJECT 1 PEN SUBCUTANEOUSLY EVERY 14 DAYS 6 pen 3   No current facility-administered medications for this visit.     Allergies:   Lipitor [atorvastatin] and Nitroglycerin   Social History:  The patient  reports that  he has never smoked. He has never used smokeless tobacco. He reports that he drinks alcohol. He reports that he does not use drugs.   Family History:  The patient's family history includes Hyperlipidemia in his father and mother.    ROS:  Please see the history of present illness.  All other systems are reviewed and negative.    PHYSICAL EXAM: VS:  BP 110/60   Pulse (!) 53   Ht 5\' 11"  (1.803 m)   Wt 187 lb 12.8 oz (85.2 kg)   SpO2 96%   BMI 26.19 kg/m  , BMI Body mass index is 26.19  kg/m. GEN: Well nourished, well developed, in no acute distress  HEENT: normal  Neck: no JVD, no masses. No carotid bruits Cardiac: RRR without murmur or gallop                Respiratory:  clear to auscultation bilaterally, normal work of breathing GI: soft, nontender, nondistended, + BS MS: no deformity or atrophy  Ext: no pretibial edema, pedal pulses 2+= bilaterally Skin: warm and dry, no rash Neuro:  Strength and sensation are intact Psych: euthymic mood, full affect  EKG:  EKG is ordered today. The ekg ordered today shows sinus bradycardia 53 bpm, otherwise within normal limits  Recent Labs: 11/17/2017: ALT 12   Lipid Panel     Component Value Date/Time   CHOL 108 11/17/2017 0750   TRIG 82 11/17/2017 0750   HDL 42 11/17/2017 0750   CHOLHDL 2.6 11/17/2017 0750   CHOLHDL 2.9 10/21/2015 0809   VLDL 15 10/21/2015 0809   LDLCALC 50 11/17/2017 0750   LDLDIRECT 62 05/07/2015 0828      Wt Readings from Last 3 Encounters:  11/21/17 187 lb 12.8 oz (85.2 kg)  11/01/16 192 lb 3.2 oz (87.2 kg)  10/21/15 190 lb 12.8 oz (86.5 kg)     ASSESSMENT AND PLAN: 1.  Coronary artery disease, native vessel, without angina: The patient is stable on aspirin for antiplatelet therapy and Praluent for lipid lowering.  No changes are made in his regimen.  He will follow-up in 1 year.  2.  Hyperlipidemia: I reviewed his most recent lipids which demonstrate an LDL cholesterol of 50 mg/dL.  He will continue on current therapy.  3.  Preoperative evaluation: The patient may require inguinal hernia surgery.  He is at low risk of cardiac complications.  He has an excellent functional capacity with no symptoms of angina.  He has normal LV function and no signs or symptoms of congestive heart failure.  It would be acceptable for him to hold aspirin for a few days if necessary.  Current medicines are reviewed with the patient today.  The patient does not have concerns regarding medicines.  Labs/ tests  ordered today include:   Orders Placed This Encounter  Procedures  . Hepatic function panel  . Lipid panel  . EKG 12-Lead    Disposition:   FU one year  Signed, [name], MD  11/21/2017 2:21 PM    [facility] [address] [place], [place], [place]  [address] Phone: [phone]; Fax: [phone]

## 2017-11-21 NOTE — Patient Instructions (Signed)
Medication Instructions:  Your provider recommends that you continue on your current medications as directed. Please refer to the Current Medication list given to you today.    Labwork: Your provider recommends that you return for FASTING lab work prior to your appointment with Dr. Excell Seltzer in 1 year.  Testing/Procedures: None  Follow-Up: Your provider wants you to follow-up in: 1 year with Dr. Excell Seltzer. You will receive a reminder letter in the mail two months in advance. If you don't receive a letter, please call our office to schedule the follow-up appointment.    Any Other Special Instructions Will Be Listed Below (If Applicable).     If you need a refill on your cardiac medications before your next appointment, please call your pharmacy.

## 2018-02-10 ENCOUNTER — Other Ambulatory Visit: Payer: Self-pay | Admitting: Pharmacist

## 2018-02-10 MED ORDER — ALIROCUMAB 75 MG/ML ~~LOC~~ SOAJ: 1.0000 "pen " | SUBCUTANEOUS | 11 refills | Status: DC

## 2018-05-21 ENCOUNTER — Other Ambulatory Visit: Payer: Self-pay | Admitting: Cardiovascular Disease

## 2018-05-22 ENCOUNTER — Other Ambulatory Visit: Payer: Self-pay | Admitting: Pharmacist

## 2018-05-22 MED ORDER — ALIROCUMAB 75 MG/ML ~~LOC~~ SOAJ: 1.0000 "pen " | SUBCUTANEOUS | 11 refills | Status: DC

## 2018-05-22 NOTE — Progress Notes (Signed)
praluent

## 2018-05-22 NOTE — Progress Notes (Signed)
praluen

## 2018-09-18 ENCOUNTER — Telehealth: Payer: Self-pay | Admitting: Cardiovascular Disease

## 2018-09-18 NOTE — Telephone Encounter (Signed)
New message:    Patient calling trying to get appt with dr. Burt Knack patient wants to see the doctor. Please call patient back.

## 2018-10-06 ENCOUNTER — Other Ambulatory Visit: Payer: Self-pay

## 2018-10-06 ENCOUNTER — Emergency Department (HOSPITAL_BASED_OUTPATIENT_CLINIC_OR_DEPARTMENT_OTHER)
Admission: EM | Admit: 2018-10-06 | Discharge: 2018-10-06 | Disposition: A | Discharge status: Home / Self Care | Payer: BC Managed Care – PPO | Attending: Emergency Medicine | Admitting: Emergency Medicine

## 2018-10-06 ENCOUNTER — Encounter (HOSPITAL_BASED_OUTPATIENT_CLINIC_OR_DEPARTMENT_OTHER): Payer: Self-pay | Admitting: *Deleted

## 2018-10-06 ENCOUNTER — Emergency Department (HOSPITAL_BASED_OUTPATIENT_CLINIC_OR_DEPARTMENT_OTHER): Payer: BC Managed Care – PPO

## 2018-10-06 DIAGNOSIS — R0789 Other chest pain: Secondary | ICD-10-CM | POA: Diagnosis present

## 2018-10-06 DIAGNOSIS — Z7982 Long term (current) use of aspirin: Secondary | ICD-10-CM | POA: Diagnosis not present

## 2018-10-06 DIAGNOSIS — R079 Chest pain, unspecified: Secondary | ICD-10-CM

## 2018-10-06 DIAGNOSIS — I259 Chronic ischemic heart disease, unspecified: Secondary | ICD-10-CM | POA: Diagnosis not present

## 2018-10-06 DIAGNOSIS — R42 Dizziness and giddiness: Secondary | ICD-10-CM | POA: Insufficient documentation

## 2018-10-06 DIAGNOSIS — Z955 Presence of coronary angioplasty implant and graft: Secondary | ICD-10-CM | POA: Insufficient documentation

## 2018-10-06 DIAGNOSIS — R202 Paresthesia of skin: Secondary | ICD-10-CM | POA: Diagnosis not present

## 2018-10-06 LAB — CBC
HCT: 41.7 % (ref 39.0–52.0)
Hemoglobin: 13.8 g/dL (ref 13.0–17.0)
MCH: 30.8 pg (ref 26.0–34.0)
MCHC: 33.1 g/dL (ref 30.0–36.0)
MCV: 93.1 fL (ref 80.0–100.0)
Platelets: 136 10*3/uL — ABNORMAL LOW (ref 150–400)
RBC: 4.48 MIL/uL (ref 4.22–5.81)
RDW: 12.9 % (ref 11.5–15.5)
WBC: 4.1 10*3/uL (ref 4.0–10.5)
nRBC: 0 % (ref 0.0–0.2)

## 2018-10-06 LAB — BASIC METABOLIC PANEL
Anion gap: 9 (ref 5–15)
BUN: 19 mg/dL (ref 6–20)
CO2: 24 mmol/L (ref 22–32)
Calcium: 8.7 mg/dL — ABNORMAL LOW (ref 8.9–10.3)
Chloride: 106 mmol/L (ref 98–111)
Creatinine, Ser: 1.2 mg/dL (ref 0.61–1.24)
GFR calc Af Amer: >60 mL/min (ref >60)
GFR calc non Af Amer: >60 mL/min (ref >60)
Glucose, Bld: 111 mg/dL — ABNORMAL HIGH (ref 70–99)
Potassium: 3.6 mmol/L (ref 3.5–5.1)
Sodium: 139 mmol/L (ref 135–145)

## 2018-10-06 LAB — TROPONIN I (HIGH SENSITIVITY)
Troponin I (High Sensitivity): 2 ng/L (ref <18)
Troponin I (High Sensitivity): 2 ng/L (ref <18)

## 2018-10-06 MED ORDER — SODIUM CHLORIDE 0.9% FLUSH
3.0000 mL | Freq: Once | INTRAVENOUS | Status: DC
  Filled 2018-10-06: qty 3

## 2018-10-06 NOTE — ED Notes (Signed)
ED Provider at bedside. 

## 2018-10-06 NOTE — ED Triage Notes (Signed)
Numbness in his hands and arms x 2 weeks. Today he got lightheaded and his arms got numb. States he could be anxious. States he might be having a little pain in his right chest. He appears anxious.

## 2018-10-06 NOTE — Discharge Instructions (Addendum)
You were seen in the emergency department for intermittent numbness in your hands and an episode today of some dizziness and chest pain.  You had blood work EKG and chest x-ray that did not show any explanation for your symptoms.  There was no evidence of any heart damage.  It will be important for you to follow-up with your primary care doctor and cardiologist for further work-up.  Please return if any worsening symptoms.

## 2018-10-06 NOTE — ED Notes (Signed)
Pt. Reports this all started today after walking up stairs and he felt his arms get numb bilat from shoulders to fingers bilat.  He felt a tightness in the R side chest at approx.2pm- 2:30   Pt. In no distress with no shob noted and reports no nausea or shob at time of chest tightness.

## 2018-10-06 NOTE — ED Provider Notes (Signed)
[facility] EMERGENCY DEPARTMENT Provider Note   CSN: [id number] Arrival date & time: 10/06/18  1449     History   Chief Complaint Chief Complaint  Patient presents with  . Chest Pain    HPI Jimmy Goodwin is a 58 y.o. male.  He is presenting with concern for bilateral hand numbness that is been on and off for a few weeks.  Sometimes it is related to a position like when he is holding a phone.  He said it experienced it again today and it rated up his arms and made him feel lightheaded like he might pass out.  He also experienced some right-sided chest discomfort when it happened.  That is resolved and lasted about 20 or 30 minutes.  He was not short of breath or nauseous or diaphoretic at the time.  He tried to reach out to his cardiologist but ultimately chose to come here for evaluation.  Says his symptoms have resolved right now.     The history is provided by the patient.  Chest Pain Pain location:  R chest Pain quality: aching   Pain radiates to:  Does not radiate Pain severity:  Mild Onset quality:  Gradual Progression:  Resolved Chronicity:  New Context: at rest   Relieved by:  None tried Worsened by:  Nothing Ineffective treatments:  None tried Associated symptoms: dizziness and numbness   Associated symptoms: no abdominal pain, no back pain, no cough, no diaphoresis, no fever, no headache, no nausea, no shortness of breath, no syncope, no vomiting and no weakness   Risk factors: coronary artery disease and high cholesterol     Past Medical History:  Diagnosis Date  . Acute MI (HCC) 10/30/13  . CAD (coronary artery disease), native coronary artery 10/30/2013   a. inf STEMI (8/15) >> DES to RCA and staged PCI with DES to CFX;  b. re-look  LHC (8/15):  Proximal LAD 40%, circumflex stent patent, mid RCA 30-40%, distal RCA stent patent, ostial PDA 50-60%, EF 55-60%, inferior HK  . Dyslipidemia 10/30/2013  . Ischemic cardiomyopathy 10/30/2013   EF 45% akinesis on the  entire inferior wall and severe hypokinesis of the basal and mid inferolateral walls     Patient Active Problem List   Diagnosis Date Noted  . Chest pain with moderate risk of acute coronary syndrome 11/23/2013  . CAD- S/P RCA DES 10/30/13 with staged CFX DES 11/01/13 11/23/2013  . Chest pain 11/23/2013  . Cardiomyopathy, ischemic 10/31/2013  . Dyslipidemia 10/31/2013  . STEMI 10/31/13 of inferior wall 10/30/2013    Past Surgical History:  Procedure Laterality Date  . CORONARY ANGIOPLASTY WITH STENT PLACEMENT  10/31/2013   Staged PCI proximal Circumflex with 3.5 x 12 mm Promus DES  . LEFT HEART CATH N/A 10/30/2013   Procedure: LEFT HEART CATH;  Surgeon: Micheline ChapmanMichael D Cooper, MD;  [facility];  Service: Cardiovascular;  Laterality: N/A;  . LEFT HEART CATHETERIZATION WITH CORONARY ANGIOGRAM N/A 11/23/2013   Procedure: LEFT HEART CATHETERIZATION WITH CORONARY ANGIOGRAM;  Surgeon: Micheline ChapmanMichael D Cooper, MD;  [facility];  Service: Cardiovascular;  Laterality: N/A;  . PERCUTANEOUS CORONARY STENT INTERVENTION (PCI-S)  10/30/2013   Procedure: PERCUTANEOUS CORONARY STENT INTERVENTION (PCI-S);  Surgeon: Micheline ChapmanMichael D Cooper, MD;  [facility];  Service: Cardiovascular;;  RCA  . PERCUTANEOUS CORONARY STENT INTERVENTION (PCI-S) N/A 10/31/2013   Procedure: PERCUTANEOUS CORONARY STENT INTERVENTION (PCI-S);  Surgeon: Kathleene Hazelhristopher D McAlhany, MD;  [facility];  Service: Cardiovascular;  Laterality: N/A;  . STEMI  10/30/2013   PCI distal RCA with 3.5x20 mm Promus DES  . TONSILLECTOMY          Home Medications    Prior to Admission medications   Medication Sig Start Date End Date Taking? Authorizing Provider  Alirocumab (PRALUENT) 75 MG/ML SOAJ Inject into the skin. 04/24/15  Yes [provider]  aspirin EC 81 MG EC tablet Take 1 tablet (81 mg total) by mouth daily. 11/01/13  Yes Brett Canales, PA-C  Alirocumab (PRALUENT) 75 MG/ML SOAJ Inject 1 pen into the skin every 14  (fourteen) days. 05/22/18   Sherren Mocha, MD  dexamethasone (DECADRON) 0.5 MG/5ML solution Take 5 mLs by mouth as directed. Swish and spit 5 ml once daily as needed for mouth 07/30/16   [provider]  fluocinonide gel (LIDEX) 5.80 % Apply 1 application topically daily as needed. mouth 08/02/16   [provider]    Family History Family History  Problem Relation Age of Onset  . Hyperlipidemia Mother   . Hyperlipidemia Father     Social History Social History   Tobacco Use  . Smoking status: Never Smoker  . Smokeless tobacco: Never Used  Substance Use Topics  . Alcohol use: Yes  . Drug use: No     Allergies   Lipitor [atorvastatin] and Nitroglycerin   Review of Systems Review of Systems  Constitutional: Negative for diaphoresis and fever.  HENT: Negative for sore throat.   Eyes: Negative for visual disturbance.  Respiratory: Negative for cough and shortness of breath.   Cardiovascular: Positive for chest pain. Negative for syncope.  Gastrointestinal: Negative for abdominal pain, nausea and vomiting.  Genitourinary: Negative for dysuria.  Musculoskeletal: Negative for back pain and neck pain.  Skin: Negative for rash.  Neurological: Positive for dizziness and numbness. Negative for weakness and headaches.     Physical Exam Updated Vital Signs BP 128/73   Pulse 82   Temp 98.2 F (36.8 C) (Oral)   Resp 20   Ht 5\' 11"  (1.803 m)   Wt 82.7 kg   SpO2 100%   BMI 25.43 kg/m   Physical Exam Vitals signs and nursing note reviewed.  Constitutional:      Appearance: He is well-developed.  HENT:     Head: Normocephalic and atraumatic.  Eyes:     Conjunctiva/sclera: Conjunctivae normal.  Neck:     Musculoskeletal: Neck supple.  Cardiovascular:     Rate and Rhythm: Normal rate and regular rhythm.     Heart sounds: No murmur.  Pulmonary:     Effort: Pulmonary effort is normal. No respiratory distress.     Breath sounds: Normal breath sounds.   Abdominal:     Palpations: Abdomen is soft.     Tenderness: There is no abdominal tenderness.  Musculoskeletal: Normal range of motion.     Right lower leg: He exhibits no tenderness.     Left lower leg: He exhibits no tenderness.  Skin:    General: Skin is warm and dry.     Capillary Refill: Capillary refill takes less than 2 seconds.  Neurological:     General: No focal deficit present.     Mental Status: He is alert and oriented to person, place, and time.     GCS: GCS eye subscore is 4. GCS verbal subscore is 5. GCS motor subscore is 6.     Cranial Nerves: Cranial nerves are intact. No cranial nerve deficit.     Sensory:  Sensation is intact.     Motor: Motor function is intact. No weakness.     Coordination: Coordination is intact.     Gait: Gait is intact.      ED Treatments / Results  Labs (all labs ordered are listed, but only abnormal results are displayed) Labs Reviewed  BASIC METABOLIC PANEL - Abnormal; Notable for the following components:      Result Value   Glucose, Bld 111 (*)    Calcium 8.7 (*)    All other components within normal limits  CBC - Abnormal; Notable for the following components:   Platelets 136 (*)    All other components within normal limits  TROPONIN I (HIGH SENSITIVITY)  TROPONIN I (HIGH SENSITIVITY)    EKG EKG Interpretation  Date/Time:  Friday October 06 2018 14:53:50 EDT Ventricular Rate:  51 PR Interval:  168 QRS Duration: 94 QT Interval:  444 QTC Calculation: 409 R Axis:   85 Text Interpretation:  Sinus bradycardia Otherwise normal ECG similar to prior 12/16 Confirmed by Meridee Score [phone]) on 10/06/2018 4:46:21 PM   Radiology Dg Chest 2 View  Result Date: 10/06/2018 CLINICAL DATA:  Chest discomfort on right. Numbness and tingling in arms. EXAM: CHEST - 2 VIEW COMPARISON:  March 14, 2015 FINDINGS: The heart size and mediastinal contours are within normal limits. Both lungs are clear. The visualized skeletal structures are  unremarkable. IMPRESSION: No active cardiopulmonary disease. Electronically Signed   By: Gerome Sam III M.D   On: 10/06/2018 15:13    Procedures Procedures (including critical care time)  Medications Ordered in ED Medications  sodium chloride flush (NS) 0.9 % injection 3 mL (3 mLs Intravenous Not Given 10/06/18 1525)     Initial Impression / Assessment and Plan / ED Course  I have reviewed the triage vital signs and the nursing notes.  Pertinent labs & imaging results that were available during my care of the patient were reviewed by me and considered in my medical decision making (see chart for details).  Clinical Course as of Oct 07 839  Fri Oct 06, 2018  [id number] 58 year old male here with intermittent paresthesias in his hands and today with same plus lightheadedness and some vague right chest discomfort that is resolved.  He has a completely benign exam now normal vitals.  EKG is unremarkable.  Differential includes peripheral neuropraxia, radiculopathy, ACS, postural hypotension, hypovolemia, anemia, metabolic derangement   [MB]    Clinical Course User Index [MB] Terrilee Files, MD        Final Clinical Impressions(s) / ED Diagnoses   Final diagnoses:  Paresthesias  Nonspecific chest pain  Dizziness    ED Discharge Orders    None       Terrilee Files, MD 10/07/18 [phone]

## 2018-10-12 ENCOUNTER — Other Ambulatory Visit: Payer: Self-pay | Admitting: Pharmacist

## 2018-10-16 NOTE — Telephone Encounter (Signed)
Called to offer appointment with Dr. Burt Knack 7/23 at 1:20PM, 2:00PM, or 4:00PM.  Left message to call back to arrange appointment.

## 2018-10-17 NOTE — Telephone Encounter (Signed)
Spoke with Pt.  Pt scheduled for 10/19/2018 at 2:00 pm  Pt requested labs be moved to 10/18/2018. Rescheduled lab appt.

## 2018-10-18 ENCOUNTER — Other Ambulatory Visit: Payer: BC Managed Care – PPO | Admitting: *Deleted

## 2018-10-18 ENCOUNTER — Other Ambulatory Visit: Payer: Self-pay

## 2018-10-18 DIAGNOSIS — I2510 Atherosclerotic heart disease of native coronary artery without angina pectoris: Secondary | ICD-10-CM

## 2018-10-18 LAB — LIPID PANEL
Chol/HDL Ratio: 2.5 ratio (ref 0.0–5.0)
Cholesterol, Total: 116 mg/dL (ref 100–199)
HDL: 47 mg/dL (ref >39)
LDL Calculated: 48 mg/dL (ref 0–99)
Triglycerides: 103 mg/dL (ref 0–149)
VLDL Cholesterol Cal: 21 mg/dL (ref 5–40)

## 2018-10-18 LAB — HEPATIC FUNCTION PANEL
ALT: 16 IU/L (ref 0–44)
AST: 21 IU/L (ref 0–40)
Albumin: 4.6 g/dL (ref 3.8–4.9)
Alkaline Phosphatase: 56 IU/L (ref 39–117)
Bilirubin Total: 0.7 mg/dL (ref 0.0–1.2)
Bilirubin, Direct: 0.2 mg/dL (ref 0.00–0.40)
Total Protein: 6.7 g/dL (ref 6.0–8.5)

## 2018-10-19 ENCOUNTER — Ambulatory Visit: Payer: BC Managed Care – PPO | Admitting: Cardiovascular Disease

## 2018-10-19 ENCOUNTER — Encounter: Payer: Self-pay | Admitting: Cardiovascular Disease

## 2018-10-19 VITALS — BP 100/52 | HR 55 | Ht 71.0 in | Wt 180.4 lb

## 2018-10-19 DIAGNOSIS — I2510 Atherosclerotic heart disease of native coronary artery without angina pectoris: Secondary | ICD-10-CM | POA: Diagnosis not present

## 2018-10-19 DIAGNOSIS — E782 Mixed hyperlipidemia: Secondary | ICD-10-CM | POA: Diagnosis not present

## 2018-10-19 NOTE — Progress Notes (Signed)
Cardiology Office Note:    Date:  10/19/2018   ID:  Jimmy Goodwin, DOB 17-Mar-1961, MRN [id number]  PCP:  Jimmy Petty, MD  Cardiologist:  Jimmy Mocha, MD  Electrophysiologist:  None   Referring MD: Jimmy Petty, MD   Chief Complaint  Patient presents with  . Coronary Artery Disease    History of Present Illness:    Jimmy Goodwin is a 58 y.o. male with a hx of CAD. He initially presented with an inferior STEMI in 2015 and was treated with primary PCI of the distal RCA with a drug-eluting stent. He underwent staged PCI of the left circumflex during his index hospitalization. Post-MI LV function was mildly depressed with an EF of 45%, but this normalized on follow-up. Re-look cardiac cath was done to evaluate recurrent symptoms and his stents were patent.  The patient is statin intolerant and he is treated with Alirocumab.  The patient is here alone today.  He has been feeling well.  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  He had a recent episode where he became lightheaded after walking up an incline.  He had no shortness of breath or chest pain.  He did not have frank syncope.  He developed numbness and tingling in his fingers bilaterally.  There were no localizing symptoms.  He has had similar episodes with exercise in the past.  He felt like he was dehydrated, but his wife insisted on him going to the emergency room for evaluation.  An EKG showed no acute changes.  High-sensitivity troponins were negative x2.  He was released after a period of observation.  The patient specifically denies any exertional chest pain or pressure.  He is otherwise asymptomatic and feels well.  Past Medical History:  Diagnosis Date  . Acute MI ([facility]) 10/30/13  . CAD (coronary artery disease), native coronary artery 10/30/2013   a. inf STEMI (8/15) >> DES to RCA and staged PCI with DES to CFX;  b. re-look  LHC (8/15):  Proximal LAD 40%, circumflex stent patent,  mid RCA 30-40%, distal RCA stent patent, ostial PDA 50-60%, EF 55-60%, inferior HK  . Dyslipidemia 10/30/2013  . Ischemic cardiomyopathy 10/30/2013   EF 45% akinesis on the entire inferior wall and severe hypokinesis of the basal and mid inferolateral walls     Past Surgical History:  Procedure Laterality Date  . CORONARY ANGIOPLASTY WITH STENT PLACEMENT  10/31/2013   Staged PCI proximal Circumflex with 3.5 x 12 mm Promus DES  . LEFT HEART CATH N/A 10/30/2013   Procedure: LEFT HEART CATH;  Surgeon: Jimmy Ohara, MD;  Location: [facility] CATH LAB;  Service: Cardiovascular;  Laterality: N/A;  . LEFT HEART CATHETERIZATION WITH CORONARY ANGIOGRAM N/A 11/23/2013   Procedure: LEFT HEART CATHETERIZATION WITH CORONARY ANGIOGRAM;  Surgeon: Jimmy Ohara, MD;  Location: [facility] CATH LAB;  Service: Cardiovascular;  Laterality: N/A;  . PERCUTANEOUS CORONARY STENT INTERVENTION (PCI-S)  10/30/2013   Procedure: PERCUTANEOUS CORONARY STENT INTERVENTION (PCI-S);  Surgeon: Jimmy Ohara, MD;  Location: [facility] CATH LAB;  Service: Cardiovascular;;  RCA  . PERCUTANEOUS CORONARY STENT INTERVENTION (PCI-S) N/A 10/31/2013   Procedure: PERCUTANEOUS CORONARY STENT INTERVENTION (PCI-S);  Surgeon: Jimmy Blanks, MD;  Location: [facility] CATH LAB;  Service: Cardiovascular;  Laterality: N/A;  . STEMI  10/30/2013   PCI distal RCA with 3.5x20 mm Promus DES  . TONSILLECTOMY      Current Medications: Current Meds  Medication Sig  . Alirocumab (PRALUENT) 75  MG/ML SOAJ Inject 1 pen into the skin every 14 (fourteen) days.  Marland Kitchen aspirin EC 81 MG EC tablet Take 1 tablet (81 mg total) by mouth daily.  Marland Kitchen dexamethasone (DECADRON) 0.5 MG/5ML solution Take 5 mLs by mouth as directed. Swish and spit 5 ml once daily as needed for mouth  . fluocinonide gel (LIDEX) 0.05 % Apply 1 application topically daily as needed. mouth     Allergies:   Lipitor [atorvastatin] and Nitroglycerin   Social History   Socioeconomic History  . Marital status: Married     Spouse name: Not on file  . Number of children: Not on file  . Years of education: Not on file  . Highest education level: Not on file  Occupational History  . Not on file  Social Needs  . Financial resource strain: Not on file  . Food insecurity    Worry: Not on file    Inability: Not on file  . Transportation needs    Medical: Not on file    Non-medical: Not on file  Tobacco Use  . Smoking status: Never Smoker  . Smokeless tobacco: Never Used  Substance and Sexual Activity  . Alcohol use: Yes  . Drug use: No  . Sexual activity: Not on file  Lifestyle  . Physical activity    Days per week: Not on file    Minutes per session: Not on file  . Stress: Not on file  Relationships  . Social Musician on phone: Not on file    Gets together: Not on file    Attends religious service: Not on file    Active member of club or organization: Not on file    Attends meetings of clubs or organizations: Not on file    Relationship status: Not on file  Other Topics Concern  . Not on file  Social History Narrative  . Not on file     Family History: The patient's family history includes Hyperlipidemia in his father and mother.  ROS:   Please see the history of present illness.    All other systems reviewed and are negative.  EKGs/Labs/Other Studies Reviewed:    EKG:  EKG is not ordered today.  The ekg from 10/06/2018 demonstrates sinus bradycardia otherwise within normal limits  Recent Labs: 10/06/2018: BUN 19; Creatinine, Ser 1.20; Hemoglobin 13.8; Platelets 136; Potassium 3.6; Sodium 139 10/18/2018: ALT 16  Recent Lipid Panel    Component Value Date/Time   CHOL 116 10/18/2018 0907   TRIG 103 10/18/2018 0907   HDL 47 10/18/2018 0907   CHOLHDL 2.5 10/18/2018 0907   CHOLHDL 2.9 10/21/2015 0809   VLDL 15 10/21/2015 0809   LDLCALC 48 10/18/2018 0907   LDLDIRECT 62 05/07/2015 0828    Physical Exam:    VS:  BP (!) 100/52   Pulse (!) 55   Ht 5\' 11"  (1.803 m)    Wt 180 lb 6.4 oz (81.8 kg)   SpO2 98%   BMI 25.16 kg/m     Wt Readings from Last 3 Encounters:  10/19/18 180 lb 6.4 oz (81.8 kg)  10/06/18 182 lb 4.8 oz (82.7 kg)  11/21/17 187 lb 12.8 oz (85.2 kg)     GEN: Well nourished, well developed in no acute distress HEENT: Normal NECK: No JVD; No carotid bruits LYMPHATICS: No lymphadenopathy CARDIAC: RRR, no murmurs, rubs, gallops RESPIRATORY:  Clear to auscultation without rales, wheezing or rhonchi  ABDOMEN: Soft, non-tender, non-distended MUSCULOSKELETAL:  No edema; No  deformity  SKIN: Warm and dry NEUROLOGIC:  Alert and oriented x 3 PSYCHIATRIC:  Normal affect   ASSESSMENT:    1. Coronary artery disease involving native coronary artery of native heart without angina pectoris   2. Mixed hyperlipidemia    PLAN:    In order of problems listed above:  1. The patient is doing well on his current medical regimen and will continue on low-dose aspirin and alirocumab. No changes are made today. 2. Yesterday's lipid panel and LFTs are reviewed as outlined above.  His panel is excellent with an LDL cholesterol 48 mg/dL.  Transaminases are normal.  Overall the patient is doing very well and I will see him back in 1 year for follow-up evaluation.   Medication Adjustments/Labs and Tests Ordered: Current medicines are reviewed at length with the patient today.  Concerns regarding medicines are outlined above.  No orders of the defined types were placed in this encounter.  No orders of the defined types were placed in this encounter.   Patient Instructions  Medication Instructions:  Your provider recommends that you continue on your current medications as directed. Please refer to the Current Medication list given to you today.    Labwork: None  Testing/Procedures: None  Follow-Up: Your provider wants you to follow-up in: 1 year with Dr. Excell Seltzerooper. You will receive a reminder letter in the mail two months in advance. If you don't  receive a letter, please call our office to schedule the follow-up appointment.    Any Other Special Instructions Will Be Listed Below (If Applicable).     If you need a refill on your cardiac medications before your next appointment, please call your pharmacy.      Signed, Tonny BollmanMichael Maymuna Detzel, MD  10/19/2018 2:45 PM    [facility]

## 2018-10-19 NOTE — Patient Instructions (Signed)

## 2018-11-17 ENCOUNTER — Other Ambulatory Visit: Payer: BC Managed Care – PPO

## 2018-11-22 ENCOUNTER — Ambulatory Visit: Payer: BC Managed Care – PPO | Admitting: Physician Assistant

## 2019-04-12 ENCOUNTER — Other Ambulatory Visit: Payer: Self-pay | Admitting: Cardiovascular Disease

## 2019-07-04 ENCOUNTER — Telehealth: Payer: Self-pay | Admitting: Cardiovascular Disease

## 2019-07-04 NOTE — Telephone Encounter (Signed)
lmomed the pt that they need to call us back if they need samples and let them know that we are currently working on apa

## 2019-07-04 NOTE — Telephone Encounter (Signed)
Pt c/o medication issue:  1. Name of Medication: Alirocumab (PRALUENT) 75 MG/ML SOAJ  2. How are you currently taking this medication (dosage and times per day)? One shot every 14 days  3. Are you having a reaction (difficulty breathing--STAT)? no  4. What is your medication issue? Patient states his medication needs approval again and he is almost out of it.

## 2019-07-05 MED ORDER — PRALUENT 75 MG/ML ~~LOC~~ SOAJ: 1.0000 "pen " | SUBCUTANEOUS | 11 refills | Status: DC

## 2019-07-05 NOTE — Addendum Note (Signed)
Addended by: Malena Peer D on: 07/05/2019 01:56 PM   Modules accepted: Orders

## 2019-07-05 NOTE — Telephone Encounter (Signed)
Pt called the office, states he has enough Praluent until the end of the month and does not need a sample. He is aware that his prior Berkley Harvey renewal is in process.

## 2019-07-05 NOTE — Telephone Encounter (Signed)
Praluent approved through 07/04/2020. New Rx sent to pharmacy. Called and LVM for patient to return call.

## 2019-10-01 ENCOUNTER — Encounter (INDEPENDENT_AMBULATORY_CARE_PROVIDER_SITE_OTHER): Payer: Self-pay | Admitting: Otolaryngology

## 2019-10-01 ENCOUNTER — Ambulatory Visit (INDEPENDENT_AMBULATORY_CARE_PROVIDER_SITE_OTHER): Payer: BC Managed Care – PPO | Admitting: Otolaryngology

## 2019-10-01 ENCOUNTER — Other Ambulatory Visit: Payer: Self-pay

## 2019-10-01 VITALS — Temp 97.7°F

## 2019-10-01 DIAGNOSIS — J310 Chronic rhinitis: Secondary | ICD-10-CM | POA: Diagnosis not present

## 2019-10-01 NOTE — Progress Notes (Signed)
HPI: Jimmy Goodwin is a 59 y.o. male who presents for evaluation of sinuses.  Damain was recently seen by his dentist who did a standard Panorex and told him that he had an "impacted" sinus and that he should see an ENT.  We unfortunately do not have the x-ray.  Patient thinks it is on the right side.  He occasionally gets discomfort on the right side or pressure in the cheek but this happens only occasionally.  He denies any trouble breathing through his nose.  No mucopurulent discharge from the nose. He has no pain and no dental discomfort.  Past Medical History:  Diagnosis Date  . Acute MI ([facility]) 10/30/13  . CAD (coronary artery disease), native coronary artery 10/30/2013   a. inf STEMI (8/15) >> DES to RCA and staged PCI with DES to CFX;  b. re-look  LHC (8/15):  Proximal LAD 40%, circumflex stent patent, mid RCA 30-40%, distal RCA stent patent, ostial PDA 50-60%, EF 55-60%, inferior HK  . Dyslipidemia 10/30/2013  . Ischemic cardiomyopathy 10/30/2013   EF 45% akinesis on the entire inferior wall and severe hypokinesis of the basal and mid inferolateral walls    Past Surgical History:  Procedure Laterality Date  . CORONARY ANGIOPLASTY WITH STENT PLACEMENT  10/31/2013   Staged PCI proximal Circumflex with 3.5 x 12 mm Promus DES  . LEFT HEART CATH N/A 10/30/2013   Procedure: LEFT HEART CATH;  Surgeon: Micheline Chapman, MD;  Location: [facility] CATH LAB;  Service: Cardiovascular;  Laterality: N/A;  . LEFT HEART CATHETERIZATION WITH CORONARY ANGIOGRAM N/A 11/23/2013   Procedure: LEFT HEART CATHETERIZATION WITH CORONARY ANGIOGRAM;  Surgeon: Micheline Chapman, MD;  Location: [facility] CATH LAB;  Service: Cardiovascular;  Laterality: N/A;  . PERCUTANEOUS CORONARY STENT INTERVENTION (PCI-S)  10/30/2013   Procedure: PERCUTANEOUS CORONARY STENT INTERVENTION (PCI-S);  Surgeon: Micheline Chapman, MD;  Location: [facility] CATH LAB;  Service: Cardiovascular;;  RCA  . PERCUTANEOUS CORONARY STENT INTERVENTION (PCI-S) N/A 10/31/2013   Procedure:  PERCUTANEOUS CORONARY STENT INTERVENTION (PCI-S);  Surgeon: Kathleene Hazel, MD;  Location: [facility] CATH LAB;  Service: Cardiovascular;  Laterality: N/A;  . STEMI  10/30/2013   PCI distal RCA with 3.5x20 mm Promus DES  . TONSILLECTOMY     Social History   Socioeconomic History  . Marital status: Married    Spouse name: Not on file  . Number of children: Not on file  . Years of education: Not on file  . Highest education level: Not on file  Occupational History  . Not on file  Tobacco Use  . Smoking status: Never Smoker  . Smokeless tobacco: Never Used  Substance and Sexual Activity  . Alcohol use: Yes  . Drug use: No  . Sexual activity: Not on file  Other Topics Concern  . Not on file  Social History Narrative  . Not on file   Social Determinants of Health   Financial Resource Strain:   . Difficulty of Paying Living Expenses:   Food Insecurity:   . Worried About Programme researcher, broadcasting/film/video in the Last Year:   . Barista in the Last Year:   Transportation Needs:   . Freight forwarder (Medical):   Marland Kitchen Lack of Transportation (Non-Medical):   Physical Activity:   . Days of Exercise per Week:   . Minutes of Exercise per Session:   Stress:   . Feeling of Stress :   Social Connections:   . Frequency of Communication with Friends  and Family:   . Frequency of Social Gatherings with Friends and Family:   . Attends Religious Services:   . Active Member of Clubs or Organizations:   . Attends Banker Meetings:   Marland Kitchen Marital Status:    Family History  Problem Relation Age of Onset  . Hyperlipidemia Mother   . Hyperlipidemia Father    Allergies  Allergen Reactions  . Lipitor [Atorvastatin] Diarrhea  . Nitroglycerin Other (See Comments)    Reaction:  Arm numbness and weakness    Prior to Admission medications   Medication Sig Start Date End Date Taking? Authorizing Provider  Alirocumab (PRALUENT) 75 MG/ML SOAJ Inject 1 pen into the skin every 14 (fourteen)  days. 07/05/19  Yes [name], MD  aspirin EC 81 MG EC tablet Take 1 tablet (81 mg total) by mouth daily. 11/01/13  Yes Dwana Melena, PA-C  dexamethasone (DECADRON) 0.5 MG/5ML solution Take 5 mLs by mouth as directed. Swish and spit 5 ml once daily as needed for mouth 07/30/16  Yes [provider]  fluocinonide gel (LIDEX) 0.05 % Apply 1 application topically daily as needed. mouth 08/02/16  Yes [provider]     Positive ROS: Otherwise negative  All other systems have been reviewed and were otherwise negative with the exception of those mentioned in the HPI and as above.  Physical Exam: Constitutional: Alert, well-appearing, no acute distress Ears: External ears without lesions or tenderness. Ear canals are clear bilaterally with intact, clear TMs.  Nasal: External nose without lesions. Septum midline with mild rhinitis.. Clear nasal passages otherwise. Nasal endoscopy was performed in the office today after spraying the nose with Afrin.  Both middle meatus regions were clear with no active drainage noted no polyps noted no swelling or erythema or mucosal abnormalities noted. Oral: Lips and gums without lesions. Tongue and palate mucosa without lesions. Posterior oropharynx clear. Neck: No palpable adenopathy or masses Respiratory: Breathing comfortably  Skin: No facial/neck lesions or rash noted.  Nasal/sinus endoscopy  Date/Time: 10/01/2019 4:32 PM Performed by: Drema Halon, MD Authorized by: Drema Halon, MD   Consent:    Consent obtained:  Verbal   Consent given by:  Patient Procedure details:    Indications: sino-nasal symptoms     Medication:  Afrin   Instrument: flexible fiberoptic nasal endoscope     Scope location: bilateral nare   Sinus:    Right middle meatus: normal     Left middle meatus: normal     Right nasopharynx: normal     Left nasopharynx: normal   Comments:     On nasal endoscopy both middle meatus regions were clear  with no anatomical abnormalities noted and normal appearing mucosa.    Assessment: Opacified maxillary sinus noted on Panorex and referred by dentist for evaluation.  Minimal clinical symptoms. Most likely represents retention cyst and would not recommend further evaluation or intervention unless this became symptomatic.  Plan: Prescribed Nasacort 2 sprays each nostril at night. I would not recommend any intervention unless this becomes more symptomatic.  Would require CT scan for better evaluation if warranted.  Narda Bonds, MD

## 2019-10-25 ENCOUNTER — Telehealth: Payer: Self-pay

## 2019-10-25 DIAGNOSIS — I2510 Atherosclerotic heart disease of native coronary artery without angina pectoris: Secondary | ICD-10-CM

## 2019-10-25 NOTE — Telephone Encounter (Signed)
Called to reschedule appointment as Dr. Excell Seltzer added in clinic next week and patient is on waiting list. Rescheduled patient to 8/5 with Dr. Excell Seltzer. Fasting labs scheduled a couple days prior to visit. The patient was grateful for assistance.

## 2019-10-30 ENCOUNTER — Other Ambulatory Visit: Payer: Self-pay

## 2019-10-30 ENCOUNTER — Other Ambulatory Visit: Payer: BC Managed Care – PPO

## 2019-10-30 DIAGNOSIS — I2510 Atherosclerotic heart disease of native coronary artery without angina pectoris: Secondary | ICD-10-CM

## 2019-10-30 LAB — COMPREHENSIVE METABOLIC PANEL
ALT: 13 IU/L (ref 0–44)
AST: 16 IU/L (ref 0–40)
Albumin/Globulin Ratio: 2 (ref 1.2–2.2)
Albumin: 4.4 g/dL (ref 3.8–4.9)
Alkaline Phosphatase: 60 IU/L (ref 48–121)
BUN/Creatinine Ratio: 20 (ref 9–20)
BUN: 20 mg/dL (ref 6–24)
Bilirubin Total: 0.6 mg/dL (ref 0.0–1.2)
CO2: 24 mmol/L (ref 20–29)
Calcium: 9 mg/dL (ref 8.7–10.2)
Chloride: 105 mmol/L (ref 96–106)
Creatinine, Ser: 1.02 mg/dL (ref 0.76–1.27)
GFR calc Af Amer: 94 mL/min/{1.73_m2} (ref >59)
GFR calc non Af Amer: 81 mL/min/{1.73_m2} (ref >59)
Globulin, Total: 2.2 g/dL (ref 1.5–4.5)
Glucose: 94 mg/dL (ref 65–99)
Potassium: 4.3 mmol/L (ref 3.5–5.2)
Sodium: 143 mmol/L (ref 134–144)
Total Protein: 6.6 g/dL (ref 6.0–8.5)

## 2019-10-30 LAB — CBC WITH DIFFERENTIAL/PLATELET
Basophils Absolute: 0 10*3/uL (ref 0.0–0.2)
Basos: 1 %
EOS (ABSOLUTE): 0.1 10*3/uL (ref 0.0–0.4)
Eos: 3 %
Hematocrit: 39.7 % (ref 37.5–51.0)
Hemoglobin: 13.8 g/dL (ref 13.0–17.7)
Immature Grans (Abs): 0 10*3/uL (ref 0.0–0.1)
Immature Granulocytes: 0 %
Lymphocytes Absolute: 1.8 10*3/uL (ref 0.7–3.1)
Lymphs: 41 %
MCH: 31.6 pg (ref 26.6–33.0)
MCHC: 34.8 g/dL (ref 31.5–35.7)
MCV: 91 fL (ref 79–97)
Monocytes Absolute: 0.3 10*3/uL (ref 0.1–0.9)
Monocytes: 8 %
Neutrophils Absolute: 2.2 10*3/uL (ref 1.4–7.0)
Neutrophils: 47 %
Platelets: 144 10*3/uL — ABNORMAL LOW (ref 150–450)
RBC: 4.37 x10E6/uL (ref 4.14–5.80)
RDW: 13.2 % (ref 11.6–15.4)
WBC: 4.5 10*3/uL (ref 3.4–10.8)

## 2019-10-30 LAB — LIPID PANEL
Chol/HDL Ratio: 2.8 ratio (ref 0.0–5.0)
Cholesterol, Total: 135 mg/dL (ref 100–199)
HDL: 49 mg/dL (ref >39)
LDL Chol Calc (NIH): 65 mg/dL (ref 0–99)
Triglycerides: 118 mg/dL (ref 0–149)
VLDL Cholesterol Cal: 21 mg/dL (ref 5–40)

## 2019-11-01 ENCOUNTER — Other Ambulatory Visit: Payer: Self-pay

## 2019-11-01 ENCOUNTER — Ambulatory Visit: Payer: BC Managed Care – PPO | Admitting: Cardiovascular Disease

## 2019-11-01 ENCOUNTER — Encounter: Payer: Self-pay | Admitting: Cardiovascular Disease

## 2019-11-01 VITALS — BP 102/68 | HR 61 | Ht 71.0 in | Wt 186.8 lb

## 2019-11-01 DIAGNOSIS — I2510 Atherosclerotic heart disease of native coronary artery without angina pectoris: Secondary | ICD-10-CM | POA: Diagnosis not present

## 2019-11-01 DIAGNOSIS — E782 Mixed hyperlipidemia: Secondary | ICD-10-CM

## 2019-11-01 NOTE — Progress Notes (Signed)
Cardiology Office Note:    Date:  11/01/2019   ID:  [name], DOB 1960/06/13, MRN [id number]  PCP:  Loyal Jacobson, MD  [facility] Cardiologist:  [name], MD  [facility] Electrophysiologist:  None   Referring MD: Loyal Jacobson, MD   Chief Complaint  Patient presents with  . Coronary Artery Disease    History of Present Illness:    Jimmy Goodwin is a 59 y.o. male with a hx of CAD. He initially presented with an inferior STEMI in 2015 and was treated with primary PCI of the distal RCA with a drug-eluting stent. He underwent staged PCI of the left circumflex during his index hospitalization. Post-MI LV function was mildly depressed with an EF of 45%, but this normalized on follow-up. Re-look cardiac cath was done to evaluate recurrent symptoms and his stents were patent.The patient is statin intolerant and he is treated with Alirocumab.  The patient is here alone today.  He is doing very well.  He remains physically active with regular aerobic exercise 4 to 5 days/week.  He denies any exertional symptoms.  He specifically denies chest pain, chest pressure, shortness of breath, leg swelling, or heart palpitations.  Past Medical History:  Diagnosis Date  . Acute MI (HCC) 10/30/13  . CAD (coronary artery disease), native coronary artery 10/30/2013   a. inf STEMI (8/15) >> DES to RCA and staged PCI with DES to CFX;  b. re-look  LHC (8/15):  Proximal LAD 40%, circumflex stent patent, mid RCA 30-40%, distal RCA stent patent, ostial PDA 50-60%, EF 55-60%, inferior HK  . Dyslipidemia 10/30/2013  . Ischemic cardiomyopathy 10/30/2013   EF 45% akinesis on the entire inferior wall and severe hypokinesis of the basal and mid inferolateral walls     Past Surgical History:  Procedure Laterality Date  . CORONARY ANGIOPLASTY WITH STENT PLACEMENT  10/31/2013   Staged PCI proximal Circumflex with 3.5 x 12 mm Promus DES  . LEFT HEART CATH N/A 10/30/2013   Procedure: LEFT HEART CATH;   Surgeon: Micheline Chapman, MD;  Location: [facility] CATH LAB;  Service: Cardiovascular;  Laterality: N/A;  . LEFT HEART CATHETERIZATION WITH CORONARY ANGIOGRAM N/A 11/23/2013   Procedure: LEFT HEART CATHETERIZATION WITH CORONARY ANGIOGRAM;  Surgeon: Micheline Chapman, MD;  Location: [facility] CATH LAB;  Service: Cardiovascular;  Laterality: N/A;  . PERCUTANEOUS CORONARY STENT INTERVENTION (PCI-S)  10/30/2013   Procedure: PERCUTANEOUS CORONARY STENT INTERVENTION (PCI-S);  Surgeon: Micheline Chapman, MD;  Location: [facility] CATH LAB;  Service: Cardiovascular;;  RCA  . PERCUTANEOUS CORONARY STENT INTERVENTION (PCI-S) N/A 10/31/2013   Procedure: PERCUTANEOUS CORONARY STENT INTERVENTION (PCI-S);  Surgeon: Kathleene Hazel, MD;  Location: [facility] CATH LAB;  Service: Cardiovascular;  Laterality: N/A;  . STEMI  10/30/2013   PCI distal RCA with 3.5x20 mm Promus DES  . TONSILLECTOMY      Current Medications: Current Meds  Medication Sig  . Alirocumab (PRALUENT) 75 MG/ML SOAJ Inject 1 pen into the skin every 14 (fourteen) days.  Marland Kitchen aspirin EC 81 MG EC tablet Take 1 tablet (81 mg total) by mouth daily.  Marland Kitchen dexamethasone (DECADRON) 0.5 MG/5ML solution Take 5 mLs by mouth as directed. Swish and spit 5 ml once daily as needed for mouth  . fluocinonide gel (LIDEX) 0.05 % Apply 1 application topically daily as needed. mouth     Allergies:   Lipitor [atorvastatin] and Nitroglycerin   Social History   Socioeconomic History  . Marital status: Married    Spouse name:  Not on file  . Number of children: Not on file  . Years of education: Not on file  . Highest education level: Not on file  Occupational History  . Not on file  Tobacco Use  . Smoking status: Never Smoker  . Smokeless tobacco: Never Used  Substance and Sexual Activity  . Alcohol use: Yes  . Drug use: No  . Sexual activity: Not on file  Other Topics Concern  . Not on file  Social History Narrative  . Not on file   Social Determinants of Health   Financial  Resource Strain:   . Difficulty of Paying Living Expenses:   Food Insecurity:   . Worried About Programme researcher, broadcasting/film/video in the Last Year:   . Barista in the Last Year:   Transportation Needs:   . Freight forwarder (Medical):   Marland Kitchen Lack of Transportation (Non-Medical):   Physical Activity:   . Days of Exercise per Week:   . Minutes of Exercise per Session:   Stress:   . Feeling of Stress :   Social Connections:   . Frequency of Communication with Friends and Family:   . Frequency of Social Gatherings with Friends and Family:   . Attends Religious Services:   . Active Member of Clubs or Organizations:   . Attends Banker Meetings:   Marland Kitchen Marital Status:      Family History: The patient's family history includes Hyperlipidemia in his father and mother.  ROS:   Please see the history of present illness.    All other systems reviewed and are negative.  EKGs/Labs/Other Studies Reviewed:    EKG:  EKG is ordered today.  The ekg ordered today demonstrates normal sinus rhythm 61 bpm, PACs, otherwise within normal limits.  Recent Labs: 10/30/2019: ALT 13; BUN 20; Creatinine, Ser 1.02; Hemoglobin 13.8; Platelets 144; Potassium 4.3; Sodium 143  Recent Lipid Panel    Component Value Date/Time   CHOL 135 10/30/2019 0828   TRIG 118 10/30/2019 0828   HDL 49 10/30/2019 0828   CHOLHDL 2.8 10/30/2019 0828   CHOLHDL 2.9 10/21/2015 0809   VLDL 15 10/21/2015 0809   LDLCALC 65 10/30/2019 0828   LDLDIRECT 62 05/07/2015 0828    Physical Exam:    VS:  BP 102/68   Pulse 61   Ht 5\' 11"  (1.803 m)   Wt 186 lb 12.8 oz (84.7 kg)   SpO2 97%   BMI 26.05 kg/m     Wt Readings from Last 3 Encounters:  11/01/19 186 lb 12.8 oz (84.7 kg)  10/19/18 180 lb 6.4 oz (81.8 kg)  10/06/18 182 lb 4.8 oz (82.7 kg)     GEN:  Well nourished, well developed in no acute distress HEENT: Normal NECK: No JVD; No carotid bruits LYMPHATICS: No lymphadenopathy CARDIAC: RRR, no murmurs, rubs,  gallops RESPIRATORY:  Clear to auscultation without rales, wheezing or rhonchi  ABDOMEN: Soft, non-tender, non-distended MUSCULOSKELETAL:  No edema; No deformity  SKIN: Warm and dry NEUROLOGIC:  Alert and oriented x 3 PSYCHIATRIC:  Normal affect   ASSESSMENT:    1. Coronary artery disease involving native coronary artery of native heart without angina pectoris   2. Mixed hyperlipidemia    PLAN:    In order of problems listed above:  1. Stable without symptoms of angina.  No cardiac-related functional limitation noted.  He continues to follow a good diet and exercise program.  He remains on aspirin for antiplatelet therapy and a  PCSK9 inhibitor in the setting of statin intolerance.  Last stress testing was 5 years ago.  We will check an exercise treadmill study (nonimaging). 2. Recent lipids reviewed with LDL cholesterol less than 70 mg/dL.  Continue Praluent.  Follow-up labs in 1 year.   Medication Adjustments/Labs and Tests Ordered: Current medicines are reviewed at length with the patient today.  Concerns regarding medicines are outlined above.  Orders Placed This Encounter  Procedures  . EXERCISE TOLERANCE TEST (ETT)  . EKG 12-Lead   No orders of the defined types were placed in this encounter.   Patient Instructions  Medication Instructions:  Your provider recommends that you continue on your current medications as directed. Please refer to the Current Medication list given to you today.   *If you need a refill on your cardiac medications before your next appointment, please call your pharmacy*  Testing/Procedures: Your provider has requested that you have an exercise tolerance test. For further information please visit [email or web address] Please also follow instruction sheet, as given.  Follow-Up: At [facility], you and your health needs are our priority.  As part of our continuing mission to provide you with exceptional heart care, we have created designated  Provider Care Teams.  These Care Teams include your primary Cardiologist (physician) and Advanced Practice Providers (APPs -  Physician Assistants and Nurse Practitioners) who all work together to provide you with the care you need, when you need it. Your next appointment:   12 month(s) The format for your next appointment:   In Person Provider:   You may see [name], MD or one of the following Advanced Practice Providers on your designated Care Team:    Tereso Newcomer, PA-C  [place], [place]      Signed, [name], MD  11/01/2019 9:01 AM    [facility]

## 2019-11-01 NOTE — Patient Instructions (Signed)
Medication Instructions:  Your provider recommends that you continue on your current medications as directed. Please refer to the Current Medication list given to you today.   *If you need a refill on your cardiac medications before your next appointment, please call your pharmacy*  Testing/Procedures: Your provider has requested that you have an exercise tolerance test. For further information please visit [email or web address] Please also follow instruction sheet, as given.  Follow-Up: At [facility], you and your health needs are our priority.  As part of our continuing mission to provide you with exceptional heart care, we have created designated Provider Care Teams.  These Care Teams include your primary Cardiologist (physician) and Advanced Practice Providers (APPs -  Physician Assistants and Nurse Practitioners) who all work together to provide you with the care you need, when you need it. Your next appointment:   12 month(s) The format for your next appointment:   In Person Provider:   You may see [name], MD or one of the following Advanced Practice Providers on your designated Care Team:    Tereso Newcomer, PA-C  [place], [place]

## 2019-11-16 ENCOUNTER — Other Ambulatory Visit (HOSPITAL_COMMUNITY)
Admission: RE | Admit: 2019-11-16 | Discharge: 2019-11-16 | Disposition: A | Discharge status: Home / Self Care | Payer: BC Managed Care – PPO | Source: Ambulatory Visit | Attending: Cardiovascular Disease | Admitting: Cardiovascular Disease

## 2019-11-16 DIAGNOSIS — Z01812 Encounter for preprocedural laboratory examination: Secondary | ICD-10-CM | POA: Diagnosis present

## 2019-11-16 DIAGNOSIS — Z20822 Contact with and (suspected) exposure to covid-19: Secondary | ICD-10-CM | POA: Diagnosis not present

## 2019-11-16 LAB — SARS CORONAVIRUS 2 (TAT 6-24 HRS): SARS Coronavirus 2: NEGATIVE

## 2019-11-20 ENCOUNTER — Ambulatory Visit (INDEPENDENT_AMBULATORY_CARE_PROVIDER_SITE_OTHER): Payer: BC Managed Care – PPO

## 2019-11-20 ENCOUNTER — Other Ambulatory Visit: Payer: Self-pay

## 2019-11-20 DIAGNOSIS — I2510 Atherosclerotic heart disease of native coronary artery without angina pectoris: Secondary | ICD-10-CM

## 2019-11-20 LAB — EXERCISE TOLERANCE TEST
Estimated workload: 13.4 METS
Exercise duration (min): 11 min
Exercise duration (sec): 0 s
MPHR: 162 {beats}/min
Peak HR: 148 {beats}/min
Percent HR: 91 %
RPE: 16
Rest HR: 59 {beats}/min

## 2019-12-31 ENCOUNTER — Ambulatory Visit: Payer: BC Managed Care – PPO | Admitting: Cardiovascular Disease

## 2020-06-15 ENCOUNTER — Other Ambulatory Visit: Payer: Self-pay | Admitting: Cardiovascular Disease

## 2020-06-15 DIAGNOSIS — E785 Hyperlipidemia, unspecified: Secondary | ICD-10-CM

## 2020-06-15 DIAGNOSIS — Z9861 Coronary angioplasty status: Secondary | ICD-10-CM

## 2020-06-15 DIAGNOSIS — I2510 Atherosclerotic heart disease of native coronary artery without angina pectoris: Secondary | ICD-10-CM

## 2020-10-14 ENCOUNTER — Telehealth: Payer: Self-pay | Admitting: Cardiovascular Disease

## 2020-10-14 DIAGNOSIS — E785 Hyperlipidemia, unspecified: Secondary | ICD-10-CM

## 2020-10-14 DIAGNOSIS — I2510 Atherosclerotic heart disease of native coronary artery without angina pectoris: Secondary | ICD-10-CM

## 2020-10-14 NOTE — Telephone Encounter (Signed)
Patient calling to speak with Dr. Earmon Phoenix nurse. He refused to give any further information and requested to ask the nurse personally.

## 2020-10-14 NOTE — Telephone Encounter (Signed)
Called and spoke with pt.  He is concerned about scheduling his 1 yr f/u appt due in August.   Advised at this point there are not appointment available with Dr Excell Seltzer.  Pt is on the wait-list.  Pt requests he be scheduled for his yearly labs.  Last year CMP, CBC and Lipid panel were drawn.  Advised pt I will forward this information to Dr Excell Seltzer for orders and someone will call him back once those have been obtained and he will be scheduled.  Pt states understanding.

## 2020-10-14 NOTE — Telephone Encounter (Signed)
Yes please order the same labs. Thank you.

## 2020-10-15 NOTE — Telephone Encounter (Signed)
Pt aware to come in for labs the first week of August fasting.

## 2020-10-20 ENCOUNTER — Other Ambulatory Visit: Payer: Self-pay

## 2020-10-20 ENCOUNTER — Other Ambulatory Visit: Payer: BC Managed Care – PPO | Admitting: *Deleted

## 2020-10-20 DIAGNOSIS — I2510 Atherosclerotic heart disease of native coronary artery without angina pectoris: Secondary | ICD-10-CM

## 2020-10-20 DIAGNOSIS — E785 Hyperlipidemia, unspecified: Secondary | ICD-10-CM

## 2020-10-20 LAB — CBC WITH DIFFERENTIAL/PLATELET
Basophils Absolute: 0 10*3/uL (ref 0.0–0.2)
Basos: 1 %
EOS (ABSOLUTE): 0.1 10*3/uL (ref 0.0–0.4)
Eos: 3 %
Hematocrit: 44.2 % (ref 37.5–51.0)
Hemoglobin: 14.9 g/dL (ref 13.0–17.7)
Immature Grans (Abs): 0 10*3/uL (ref 0.0–0.1)
Immature Granulocytes: 0 %
Lymphocytes Absolute: 1.9 10*3/uL (ref 0.7–3.1)
Lymphs: 43 %
MCH: 31.2 pg (ref 26.6–33.0)
MCHC: 33.7 g/dL (ref 31.5–35.7)
MCV: 93 fL (ref 79–97)
Monocytes Absolute: 0.3 10*3/uL (ref 0.1–0.9)
Monocytes: 7 %
Neutrophils Absolute: 2.1 10*3/uL (ref 1.4–7.0)
Neutrophils: 46 %
Platelets: 149 10*3/uL — ABNORMAL LOW (ref 150–450)
RBC: 4.78 x10E6/uL (ref 4.14–5.80)
RDW: 12.8 % (ref 11.6–15.4)
WBC: 4.5 10*3/uL (ref 3.4–10.8)

## 2020-10-20 LAB — COMPREHENSIVE METABOLIC PANEL
ALT: 11 IU/L (ref 0–44)
AST: 14 IU/L (ref 0–40)
Albumin/Globulin Ratio: 1.7 (ref 1.2–2.2)
Albumin: 4.3 g/dL (ref 3.8–4.9)
Alkaline Phosphatase: 61 IU/L (ref 44–121)
BUN/Creatinine Ratio: 18 (ref 9–20)
BUN: 20 mg/dL (ref 6–24)
Bilirubin Total: 0.6 mg/dL (ref 0.0–1.2)
CO2: 24 mmol/L (ref 20–29)
Calcium: 9 mg/dL (ref 8.7–10.2)
Chloride: 105 mmol/L (ref 96–106)
Creatinine, Ser: 1.11 mg/dL (ref 0.76–1.27)
Globulin, Total: 2.5 g/dL (ref 1.5–4.5)
Glucose: 98 mg/dL (ref 65–99)
Potassium: 4.4 mmol/L (ref 3.5–5.2)
Sodium: 141 mmol/L (ref 134–144)
Total Protein: 6.8 g/dL (ref 6.0–8.5)
eGFR: 77 mL/min/{1.73_m2} (ref >59)

## 2020-10-20 LAB — LIPID PANEL
Chol/HDL Ratio: 2.7 ratio (ref 0.0–5.0)
Cholesterol, Total: 141 mg/dL (ref 100–199)
HDL: 53 mg/dL (ref >39)
LDL Chol Calc (NIH): 70 mg/dL (ref 0–99)
Triglycerides: 94 mg/dL (ref 0–149)
VLDL Cholesterol Cal: 18 mg/dL (ref 5–40)

## 2020-10-21 ENCOUNTER — Encounter: Payer: Self-pay | Admitting: Cardiovascular Disease

## 2020-10-21 ENCOUNTER — Ambulatory Visit: Payer: BC Managed Care – PPO | Admitting: Cardiovascular Disease

## 2020-10-21 VITALS — BP 108/70 | HR 52 | Ht 71.0 in | Wt 186.0 lb

## 2020-10-21 DIAGNOSIS — E782 Mixed hyperlipidemia: Secondary | ICD-10-CM | POA: Diagnosis not present

## 2020-10-21 DIAGNOSIS — I2510 Atherosclerotic heart disease of native coronary artery without angina pectoris: Secondary | ICD-10-CM | POA: Diagnosis not present

## 2020-10-21 NOTE — Patient Instructions (Addendum)
Medication Instructions:  Your physician recommends that you continue on your current medications as directed. Please refer to the Current Medication list given to you today.  *If you need a refill on your cardiac medications before your next appointment, please call your pharmacy*   Lab Work: Your physician recommends that you return for lab work in: 12 months on the day of or a few days before your office visit with Dr. Excell Seltzer.  You will need to FAST for this appointment - nothing to eat or drink after midnight the night before except water.  If you have labs (blood work) drawn today and your tests are completely normal, you will receive your results only by: MyChart Message (if you have MyChart) OR A paper copy in the mail If you have any lab test that is abnormal or we need to change your treatment, we will call you to review the results.   Testing/Procedures: Your physician has requested that you have an exercise tolerance test in 1 year prior to your office visit. For further information please visit [email or web address] Please also follow instruction sheet, as given.    Follow-Up: At [facility], you and your health needs are our priority.  As part of our continuing mission to provide you with exceptional heart care, we have created designated Provider Care Teams.  These Care Teams include your primary Cardiologist (physician) and Advanced Practice Providers (APPs -  Physician Assistants and Nurse Practitioners) who all work together to provide you with the care you need, when you need it.   Your next appointment:   1 year(s)  The format for your next appointment:   In Person  Provider:   You may see [name], MD or one of the following Advanced Practice Providers on your designated Care Team:   Tereso Newcomer, PA-C [place], [place]

## 2020-10-21 NOTE — Progress Notes (Signed)
Cardiology Office Note:    Date:  10/21/2020   ID:  Jimmy Goodwin, DOB 1960/12/02, MRN [id number]  PCP:  Jimmy Jacobson, MD   [facility] Providers Cardiologist:  [name], MD     Referring MD: Jimmy Jacobson, MD   Chief Complaint  Patient presents with   Coronary Artery Disease     History of Present Illness:    Jimmy Goodwin is a 60 y.o. male with a hx of CAD. He initially presented with an inferior STEMI in 2015 and was treated with primary PCI of the distal RCA with a drug-eluting stent. He underwent staged PCI of the left circumflex during his index hospitalization. Post-MI LV function was mildly depressed with an EF of 45%, but this normalized on follow-up. Re-look cardiac cath was done to evaluate recurrent symptoms and his stents were patent.   The patient is statin intolerant and he is treated with Alirocumab.  The patient is here alone today.  He is doing very well.  He continues to exercise 4 to 5 days/week without any exertional symptoms. Today, he denies symptoms of palpitations, chest pain, shortness of breath, orthopnea, PND, lower extremity edema, dizziness, or syncope.   Past Medical History:  Diagnosis Date   Acute MI (HCC) 10/30/13   CAD (coronary artery disease), native coronary artery 10/30/2013   a. inf STEMI (8/15) >> DES to RCA and staged PCI with DES to CFX;  b. re-look  LHC (8/15):  Proximal LAD 40%, circumflex stent patent, mid RCA 30-40%, distal RCA stent patent, ostial PDA 50-60%, EF 55-60%, inferior HK   Dyslipidemia 10/30/2013   Ischemic cardiomyopathy 10/30/2013   EF 45% akinesis on the entire inferior wall and severe hypokinesis of the basal and mid inferolateral walls     Past Surgical History:  Procedure Laterality Date   CORONARY ANGIOPLASTY WITH STENT PLACEMENT  10/31/2013   Staged PCI proximal Circumflex with 3.5 x 12 mm Promus DES   LEFT HEART CATH N/A 10/30/2013   Procedure: LEFT HEART CATH;  Surgeon: Micheline Chapman, MD;  Location[facility]  CATH LAB;  Service: Cardiovascular;  Laterality: N/A;   LEFT HEART CATHETERIZATION WITH CORONARY ANGIOGRAM N/A 11/23/2013   Procedure: LEFT HEART CATHETERIZATION WITH CORONARY ANGIOGRAM;  Surgeon: Micheline Chapman, MD;  Location: [facility] CATH LAB;  Service: Cardiovascular;  Laterality: N/A;   PERCUTANEOUS CORONARY STENT INTERVENTION (PCI-S)  10/30/2013   Procedure: PERCUTANEOUS CORONARY STENT INTERVENTION (PCI-S);  Surgeon: Micheline Chapman, MD;  Location: [facility] CATH LAB;  Service: Cardiovascular;;  RCA   PERCUTANEOUS CORONARY STENT INTERVENTION (PCI-S) N/A 10/31/2013   Procedure: PERCUTANEOUS CORONARY STENT INTERVENTION (PCI-S);  Surgeon: Kathleene Hazel, MD;  Location: [facility] CATH LAB;  Service: Cardiovascular;  Laterality: N/A;   STEMI  10/30/2013   PCI distal RCA with 3.5x20 mm Promus DES   TONSILLECTOMY      Current Medications: Current Meds  Medication Sig   aspirin EC 81 MG EC tablet Take 1 tablet (81 mg total) by mouth daily.   dexamethasone (DECADRON) 0.5 MG/5ML solution Take 5 mLs by mouth as directed. Swish and spit 5 ml once daily as needed for mouth   fluocinonide gel (LIDEX) 0.05 % Apply 1 application topically daily as needed. mouth   PRALUENT 75 MG/ML SOAJ INJECT 1 PEN INTO THE SKIN EVERY 14 (FOURTEEN) DAYS.     Allergies:   Lipitor [atorvastatin] and Nitroglycerin   Social History   Socioeconomic History   Marital status: Married    Spouse name: Not  on file   Number of children: Not on file   Years of education: Not on file   Highest education level: Not on file  Occupational History   Not on file  Tobacco Use   Smoking status: Never   Smokeless tobacco: Never  Substance and Sexual Activity   Alcohol use: Yes   Drug use: No   Sexual activity: Not on file  Other Topics Concern   Not on file  Social History Narrative   Not on file   Social Determinants of Health   Financial Resource Strain: Not on file  Food Insecurity: Not on file  Transportation Needs: Not on  file  Physical Activity: Not on file  Stress: Not on file  Social Connections: Not on file     Family History: The patient's family history includes Hyperlipidemia in his father and mother.  ROS:   Please see the history of present illness.    All other systems reviewed and are negative.  EKGs/Labs/Other Studies Reviewed:    The following studies were reviewed today: GXT 12/02/19: Study Highlights    Blood pressure demonstrated a normal response to exercise. There was no ST segment deviation noted during stress. No T wave inversion was noted during stress.   Normal ECG stress test. Occasional PACs and PVcs are seen, particularly during the recovery period.   Stress Measurements  Baseline Vitals  Rest HR 59 bpm      Rest BP 125/73 mmHg      Exercise Time  Exercise duration (min) 11 min      Exercise duration (sec) 0 sec      Peak Stress Vitals  Peak HR 148 bpm      Peak BP 173/71 mmHg      Exercise Data  MPHR 162 bpm      Percent HR 91 %      RPE 16       Estimated workload 13.4 METS        EKG:  EKG is ordered today.  The ekg ordered today demonstrates sinus bradycardia 52 bpm, otherwise within normal limits  Recent Labs: 10/20/2020: ALT 11; BUN 20; Creatinine, Ser 1.11; Hemoglobin 14.9; Platelets 149; Potassium 4.4; Sodium 141  Recent Lipid Panel    Component Value Date/Time   CHOL 141 10/20/2020 0000   TRIG 94 10/20/2020 0000   HDL 53 10/20/2020 0000   CHOLHDL 2.7 10/20/2020 0000   CHOLHDL 2.9 10/21/2015 0809   VLDL 15 10/21/2015 0809   LDLCALC 70 10/20/2020 0000   LDLDIRECT 62 05/07/2015 0828     Risk Assessment/Calculations:           Physical Exam:    VS:  BP 108/70   Pulse (!) 52   Ht 5\' 11"  (1.803 m)   Wt 186 lb (84.4 kg)   SpO2 97%   BMI 25.94 kg/m     Wt Readings from Last 3 Encounters:  10/21/20 186 lb (84.4 kg)  11/01/19 186 lb 12.8 oz (84.7 kg)  10/19/18 180 lb 6.4 oz (81.8 kg)     GEN:  Well  nourished, well developed in no acute distress HEENT: Normal NECK: No JVD; No carotid bruits LYMPHATICS: No lymphadenopathy CARDIAC: RRR, no murmurs, rubs, gallops RESPIRATORY:  Clear to auscultation without rales, wheezing or rhonchi  ABDOMEN: Soft, non-tender, non-distended MUSCULOSKELETAL:  No edema; No deformity  SKIN: Warm and dry NEUROLOGIC:  Alert and oriented x 3 PSYCHIATRIC:  Normal affect   ASSESSMENT:    1. Coronary  artery disease involving native coronary artery of native heart without angina pectoris   2. Mixed hyperlipidemia    PLAN:    In order of problems listed above:  The patient continues to do well on his current medical program which is minimized to low-dose aspirin and lipid treatment with Praluent.  He runs a low normal blood pressure.  He has excellent exercise tolerance with no exertional symptoms.  I reviewed his stress test from last year which showed no significant abnormalities.  He will return in 1 year with an exercise treadmill stress test.  He will continue his current medical program.  No changes are made today. Lipids are at goal with an LDL cholesterol of 70 mg/dL on Praluent.  The patient is statin intolerant.  Repeat labs in 1 year.  Continue current exercise and diet program as he is doing quite well with this.   Medication Adjustments/Labs and Tests Ordered: Current medicines are reviewed at length with the patient today.  Concerns regarding medicines are outlined above.  Orders Placed This Encounter  Procedures   CBC   Lipid Profile   Basic Metabolic Panel (BMET)   Hepatic function panel   Exercise Tolerance Test   EKG 12-Lead    No orders of the defined types were placed in this encounter.   Patient Instructions  Medication Instructions:  Your physician recommends that you continue on your current medications as directed. Please refer to the Current Medication list given to you today.  *If you need a refill on your cardiac  medications before your next appointment, please call your pharmacy*   Lab Work: Your physician recommends that you return for lab work in: 12 months on the day of or a few days before your office visit with Dr. Excell Seltzer.  You will need to FAST for this appointment - nothing to eat or drink after midnight the night before except water.  If you have labs (blood work) drawn today and your tests are completely normal, you will receive your results only by: MyChart Message (if you have MyChart) OR A paper copy in the mail If you have any lab test that is abnormal or we need to change your treatment, we will call you to review the results.   Testing/Procedures: Your physician has requested that you have an exercise tolerance test in 1 year prior to your office visit. For further information please visit [email or web address] Please also follow instruction sheet, as given.    Follow-Up: At [facility], you and your health needs are our priority.  As part of our continuing mission to provide you with exceptional heart care, we have created designated Provider Care Teams.  These Care Teams include your primary Cardiologist (physician) and Advanced Practice Providers (APPs -  Physician Assistants and Nurse Practitioners) who all work together to provide you with the care you need, when you need it.   Your next appointment:   1 year(s)  The format for your next appointment:   In Person  Provider:   You may see [name], MD or one of the following Advanced Practice Providers on your designated Care Team:   Tereso Newcomer, PA-C [place], [place]    Signed, [name], MD  10/21/2020 5:45 PM    [facility]

## 2021-01-02 IMAGING — DX CHEST - 2 VIEW
2 series · 2 of 2 positions shown · non-contrast
Comparison: March 14, 2015

CLINICAL DATA: Chest discomfort on right. Numbness and tingling in
arms.

EXAM:
CHEST - 2 VIEW

[chest pa]
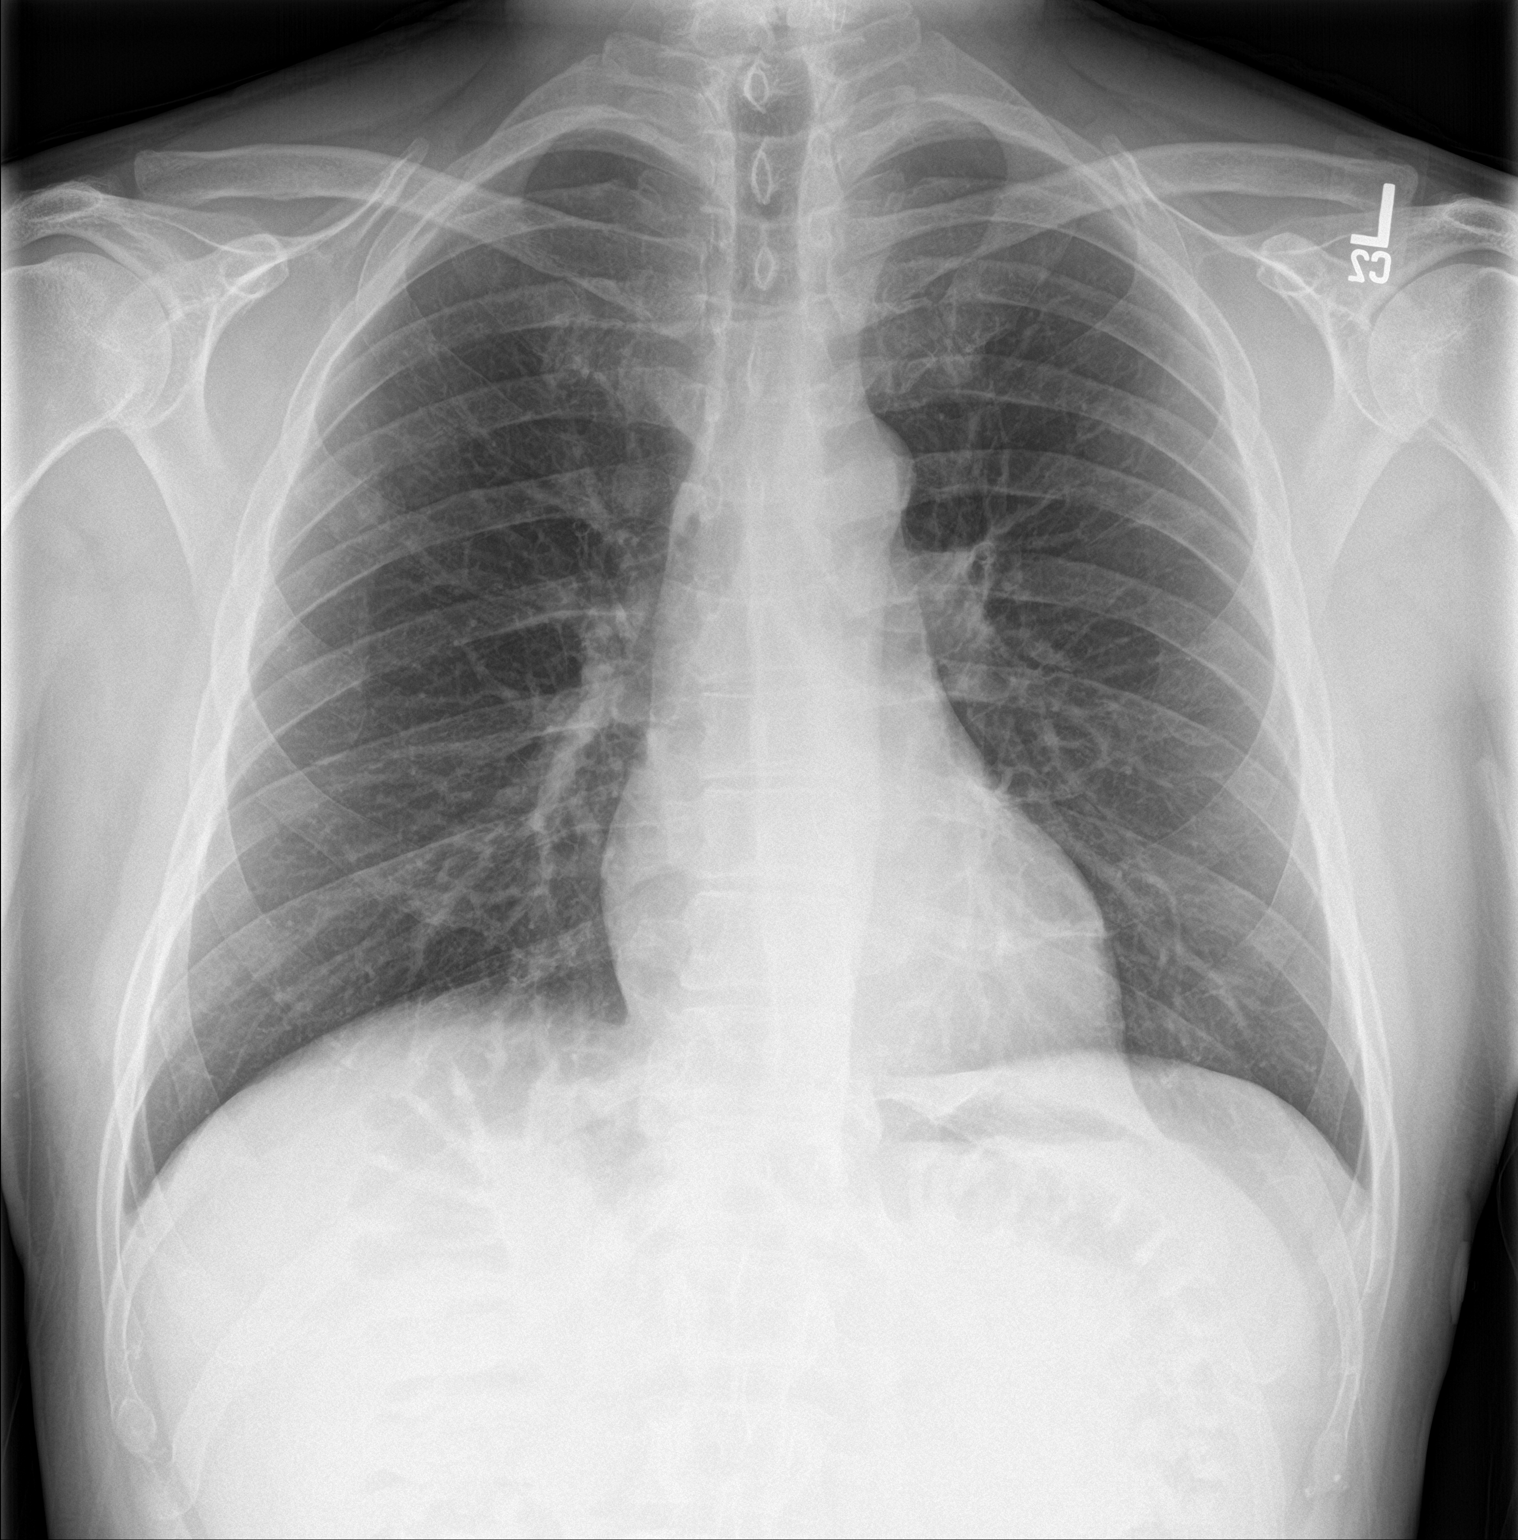

[chest lat]
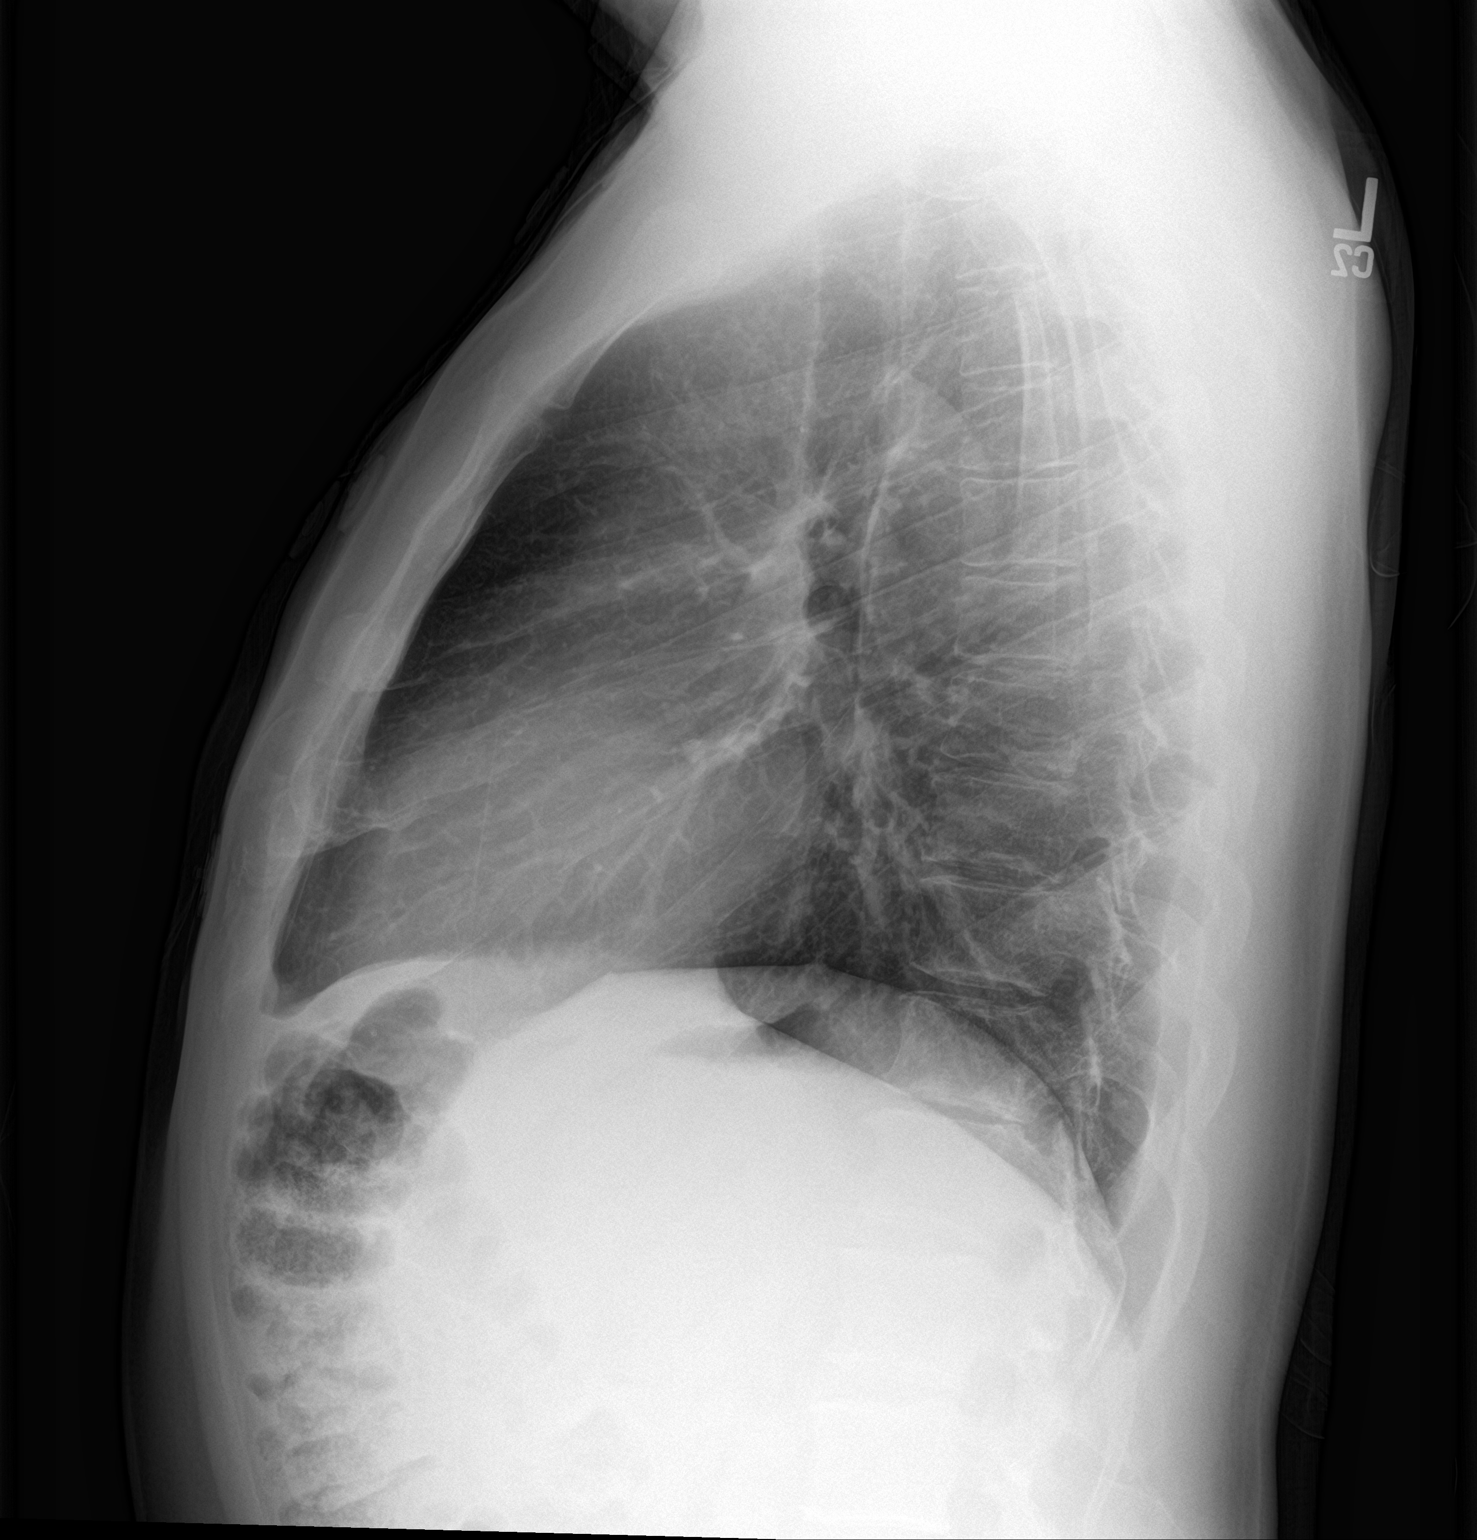

[2 of 2 positions shown; findings below may reference images not displayed]

FINDINGS: The heart size and mediastinal contours are within normal limits.
Both lungs are clear. The visualized skeletal structures are
unremarkable.
IMPRESSION: No active cardiopulmonary disease.

## 2021-05-30 ENCOUNTER — Other Ambulatory Visit: Payer: Self-pay | Admitting: Cardiovascular Disease

## 2021-05-30 DIAGNOSIS — E785 Hyperlipidemia, unspecified: Secondary | ICD-10-CM

## 2021-05-30 DIAGNOSIS — I2510 Atherosclerotic heart disease of native coronary artery without angina pectoris: Secondary | ICD-10-CM

## 2021-09-21 ENCOUNTER — Telehealth: Payer: Self-pay

## 2021-09-30 ENCOUNTER — Other Ambulatory Visit: Payer: Self-pay | Admitting: Cardiovascular Disease

## 2021-09-30 DIAGNOSIS — I2510 Atherosclerotic heart disease of native coronary artery without angina pectoris: Secondary | ICD-10-CM

## 2021-09-30 NOTE — Progress Notes (Signed)
Per Orpha Bur, needs attestation form for upcoming ETT test that was ordered 10/21/20. Placed at this time and routing to MD for signature.

## 2021-10-01 ENCOUNTER — Other Ambulatory Visit: Payer: Self-pay | Admitting: Cardiovascular Disease

## 2021-10-01 DIAGNOSIS — I2510 Atherosclerotic heart disease of native coronary artery without angina pectoris: Secondary | ICD-10-CM

## 2021-10-05 NOTE — Telephone Encounter (Signed)
Received paperwork to fill out for formulary exception for Praluent. Forms and documentation faxed to [facility] [employer].

## 2021-10-06 NOTE — Telephone Encounter (Signed)
Praluent PA approved. Called pt and LVM for him to call back.

## 2021-10-06 NOTE — Telephone Encounter (Signed)
Patient made aware that Praluent has been approved.

## 2021-10-15 ENCOUNTER — Ambulatory Visit: Payer: BC Managed Care – PPO

## 2021-10-15 DIAGNOSIS — E782 Mixed hyperlipidemia: Secondary | ICD-10-CM

## 2021-10-15 DIAGNOSIS — I2510 Atherosclerotic heart disease of native coronary artery without angina pectoris: Secondary | ICD-10-CM

## 2021-10-15 LAB — EXERCISE TOLERANCE TEST
Angina Index: 0
Duke Treadmill Score: 12
Estimated workload: 13.4
Exercise duration (min): 12 min
Exercise duration (sec): 0 s
MPHR: 161 {beats}/min
Peak HR: 160 {beats}/min
Percent HR: 99 %
RPE: 15
Rest HR: 59 {beats}/min
ST Depression (mm): 0.5 mm

## 2021-10-19 ENCOUNTER — Other Ambulatory Visit: Payer: BC Managed Care – PPO

## 2021-10-19 DIAGNOSIS — E782 Mixed hyperlipidemia: Secondary | ICD-10-CM

## 2021-10-19 DIAGNOSIS — I2510 Atherosclerotic heart disease of native coronary artery without angina pectoris: Secondary | ICD-10-CM

## 2021-10-19 LAB — BASIC METABOLIC PANEL
BUN/Creatinine Ratio: 17 (ref 9–20)
BUN: 20 mg/dL (ref 6–24)
CO2: 25 mmol/L (ref 20–29)
Calcium: 9 mg/dL (ref 8.7–10.2)
Chloride: 103 mmol/L (ref 96–106)
Creatinine, Ser: 1.16 mg/dL (ref 0.76–1.27)
Glucose: 95 mg/dL (ref 70–99)
Potassium: 4.3 mmol/L (ref 3.5–5.2)
Sodium: 139 mmol/L (ref 134–144)
eGFR: 73 mL/min/{1.73_m2} (ref >59)

## 2021-10-19 LAB — CBC
Hematocrit: 42.1 % (ref 37.5–51.0)
Hemoglobin: 14.7 g/dL (ref 13.0–17.7)
MCH: 31.7 pg (ref 26.6–33.0)
MCHC: 34.9 g/dL (ref 31.5–35.7)
MCV: 91 fL (ref 79–97)
Platelets: 155 10*3/uL (ref 150–450)
RBC: 4.63 x10E6/uL (ref 4.14–5.80)
RDW: 13.3 % (ref 11.6–15.4)
WBC: 4.3 10*3/uL (ref 3.4–10.8)

## 2021-10-19 LAB — HEPATIC FUNCTION PANEL
ALT: 16 IU/L (ref 0–44)
AST: 16 IU/L (ref 0–40)
Albumin: 4.3 g/dL (ref 3.8–4.9)
Alkaline Phosphatase: 58 IU/L (ref 44–121)
Bilirubin Total: 0.6 mg/dL (ref 0.0–1.2)
Bilirubin, Direct: 0.17 mg/dL (ref 0.00–0.40)
Total Protein: 6.8 g/dL (ref 6.0–8.5)

## 2021-10-19 LAB — LIPID PANEL
Chol/HDL Ratio: 2.7 ratio (ref 0.0–5.0)
Cholesterol, Total: 142 mg/dL (ref 100–199)
HDL: 52 mg/dL (ref >39)
LDL Chol Calc (NIH): 69 mg/dL (ref 0–99)
Triglycerides: 116 mg/dL (ref 0–149)
VLDL Cholesterol Cal: 21 mg/dL (ref 5–40)

## 2021-11-18 ENCOUNTER — Encounter: Payer: Self-pay | Admitting: Cardiovascular Disease

## 2021-11-18 ENCOUNTER — Ambulatory Visit: Payer: BC Managed Care – PPO | Admitting: Cardiovascular Disease

## 2021-11-18 VITALS — BP 100/60 | HR 68 | Ht 71.0 in | Wt 195.0 lb

## 2021-11-18 DIAGNOSIS — I2510 Atherosclerotic heart disease of native coronary artery without angina pectoris: Secondary | ICD-10-CM

## 2021-11-18 DIAGNOSIS — E782 Mixed hyperlipidemia: Secondary | ICD-10-CM | POA: Diagnosis not present

## 2021-11-18 NOTE — Patient Instructions (Addendum)
Medication Instructions:  Your physician recommends that you continue on your current medications as directed. Please refer to the Current Medication list given to you today.  *If you need a refill on your cardiac medications before your next appointment, please call your pharmacy*   Lab Work: CBC, CMET, LIPIDS (prior to next visit) If you have labs (blood work) drawn today and your tests are completely normal, you will receive your results only by: MyChart Message (if you have MyChart) OR A paper copy in the mail If you have any lab test that is abnormal or we need to change your treatment, we will call you to review the results.   Testing/Procedures: Exercise Treadmill test (in 1 year, prior to visit) Your physician has requested that you have an exercise tolerance test. For further information please visit [email or web address] Please also follow instruction sheet, as given.   Follow-Up: At [facility], you and your health needs are our priority.  As part of our continuing mission to provide you with exceptional heart care, we have created designated Provider Care Teams.  These Care Teams include your primary Cardiologist (physician) and Advanced Practice Providers (APPs -  Physician Assistants and Nurse Practitioners) who all work together to provide you with the care you need, when you need it.  We recommend signing up for the patient portal called "MyChart".  Sign up information is provided on this After Visit Summary.  MyChart is used to connect with patients for Virtual Visits (Telemedicine).  Patients are able to view lab/test results, encounter notes, upcoming appointments, etc.  Non-urgent messages can be sent to your provider as well.   To learn more about what you can do with MyChart, go to [email or web address].    Your next appointment:   1 year(s)  The format for your next appointment:   In Person  Provider:   [name], MD      Important Information About  Sugar

## 2021-11-18 NOTE — Progress Notes (Signed)
Cardiology Office Note:    Date:  11/18/2021   ID:  Jimmy Goodwin, DOB [birth year], MRN [id number]  PCP:  Loyal Jacobson, MD   [facility] Providers Cardiologist:  [name], MD     Referring MD: Loyal Jacobson, MD   Chief Complaint  Patient presents with   Coronary Artery Disease    History of Present Illness:    Jimmy Goodwin is a 61 y.o. male with a hx of CAD. He initially presented with an inferior STEMI in 2015 and was treated with primary PCI of the distal RCA with a drug-eluting stent. He underwent staged PCI of the left circumflex during his index hospitalization. Post-MI LV function was mildly depressed with an EF of 45%, but this normalized on follow-up. Re-look cardiac cath was done to evaluate recurrent symptoms and his stents were patent.   The patient is statin intolerant and he is treated with Alirocumab.  He is here alone today for follow-up evaluation.  He recently had lab work and an exercise treadmill study performed.  He had an excellent result with his exercise treadmill study with no angina, normal blood pressure response, and no ischemic EKG changes.  His labs are reviewed and his LDL cholesterol is at goal most recently checked at 69 mg/dL.  From a symptomatic standpoint, he is doing fine and has no cardiac-related symptoms.  He specifically denies chest pain, chest pressure, shortness of breath, or heart palpitations.  He denies any exertional symptoms and he exercises regularly.  Past Medical History:  Diagnosis Date   Acute MI (HCC) 10/30/13   CAD (coronary artery disease), native coronary artery 10/30/2013   a. inf STEMI (8/15) >> DES to RCA and staged PCI with DES to CFX;  b. re-look  LHC (8/15):  Proximal LAD 40%, circumflex stent patent, mid RCA 30-40%, distal RCA stent patent, ostial PDA 50-60%, EF 55-60%, inferior HK   Dyslipidemia 10/30/2013   Ischemic cardiomyopathy 10/30/2013   EF 45% akinesis on the entire inferior wall and severe hypokinesis  of the basal and mid inferolateral walls     Past Surgical History:  Procedure Laterality Date   CORONARY ANGIOPLASTY WITH STENT PLACEMENT  10/31/2013   Staged PCI proximal Circumflex with 3.5 x 12 mm Promus DES   LEFT HEART CATH N/A 10/30/2013   Procedure: LEFT HEART CATH;  Surgeon: Micheline Chapman, MD;  Location: [facility] CATH LAB;  Service: Cardiovascular;  Laterality: N/A;   LEFT HEART CATHETERIZATION WITH CORONARY ANGIOGRAM N/A 11/23/2013   Procedure: LEFT HEART CATHETERIZATION WITH CORONARY ANGIOGRAM;  Surgeon: Micheline Chapman, MD;  Location: [facility] CATH LAB;  Service: Cardiovascular;  Laterality: N/A;   PERCUTANEOUS CORONARY STENT INTERVENTION (PCI-S)  10/30/2013   Procedure: PERCUTANEOUS CORONARY STENT INTERVENTION (PCI-S);  Surgeon: Micheline Chapman, MD;  Location: [facility] CATH LAB;  Service: Cardiovascular;;  RCA   PERCUTANEOUS CORONARY STENT INTERVENTION (PCI-S) N/A 10/31/2013   Procedure: PERCUTANEOUS CORONARY STENT INTERVENTION (PCI-S);  Surgeon: Kathleene Hazel, MD;  Location: [facility] CATH LAB;  Service: Cardiovascular;  Laterality: N/A;   STEMI  10/30/2013   PCI distal RCA with 3.5x20 mm Promus DES   TONSILLECTOMY      Current Medications: Current Meds  Medication Sig   aspirin EC 81 MG EC tablet Take 1 tablet (81 mg total) by mouth daily.   dexamethasone (DECADRON) 0.5 MG/5ML solution Take 5 mLs by mouth as directed. Swish and spit 5 ml once daily as needed for mouth   fluocinonide gel (LIDEX) 0.05 %  Apply 1 application topically daily as needed. mouth   hydrocortisone 2.5 % cream Apply topically 2 (two) times daily as needed.   PRALUENT 75 MG/ML SOAJ INJECT 1 PEN INTO THE SKIN EVERY 14 (FOURTEEN) DAYS.     Allergies:   Lipitor [atorvastatin], Nitroglycerin, and Repatha [evolocumab]   Social History   Socioeconomic History   Marital status: Married    Spouse name: Not on file   Number of children: Not on file   Years of education: Not on file   Highest education level: Not on file   Occupational History   Not on file  Tobacco Use   Smoking status: Never   Smokeless tobacco: Never  Substance and Sexual Activity   Alcohol use: Yes   Drug use: No   Sexual activity: Not on file  Other Topics Concern   Not on file  Social History Narrative   Not on file   Social Determinants of Health   Financial Resource Strain: Not on file  Food Insecurity: Not on file  Transportation Needs: Not on file  Physical Activity: Not on file  Stress: Not on file  Social Connections: Not on file     Family History: The patient's family history includes Hyperlipidemia in his father and mother.  ROS:   Please see the history of present illness.    All other systems reviewed and are negative.  EKGs/Labs/Other Studies Reviewed:    The following studies were reviewed today: GXT:   The ECG was negative for ischemia. This is a low risk study.   Exercise capacity was excellent. Patient exercised for 12 min and 0 sec. Maximum HR of 160 bpm. MPHR 99.0 %. Peak METS 13.4 .   Non-ischemic,0.5 mm of up sloping ST depression (II, III, aVF, V5 and V6) was noted   Normal heart rate and blood pressure response to exercise.  EKG:  EKG is not ordered today.   Recent Labs: 10/19/2021: ALT 16; BUN 20; Creatinine, Ser 1.16; Hemoglobin 14.7; Platelets 155; Potassium 4.3; Sodium 139  Recent Lipid Panel    Component Value Date/Time   CHOL 142 10/19/2021 0908   TRIG 116 10/19/2021 0908   HDL 52 10/19/2021 0908   CHOLHDL 2.7 10/19/2021 0908   CHOLHDL 2.9 10/21/2015 0809   VLDL 15 10/21/2015 0809   LDLCALC 69 10/19/2021 0908   LDLDIRECT 62 05/07/2015 0828     Risk Assessment/Calculations:                Physical Exam:    VS:  BP 100/60   Pulse 68   Ht 5\' 11"  (1.803 m)   Wt 195 lb (88.5 kg)   SpO2 96%   BMI 27.20 kg/m     Wt Readings from Last 3 Encounters:  11/18/21 195 lb (88.5 kg)  10/21/20 186 lb (84.4 kg)  11/01/19 186 lb 12.8 oz (84.7 kg)     GEN:  Well nourished,  well developed in no acute distress HEENT: Normal NECK: No JVD; No carotid bruits LYMPHATICS: No lymphadenopathy CARDIAC: RRR, no murmurs, rubs, gallops RESPIRATORY:  Clear to auscultation without rales, wheezing or rhonchi  ABDOMEN: Soft, non-tender, non-distended MUSCULOSKELETAL:  No edema; No deformity  SKIN: Warm and dry NEUROLOGIC:  Alert and oriented x 3 PSYCHIATRIC:  Normal affect   ASSESSMENT:    1. Coronary artery disease involving native coronary artery of native heart without angina pectoris   2. Mixed hyperlipidemia    PLAN:    In order of problems listed  above:  The patient remains clinically stable with no symptoms of angina.  He maintains an excellent exercise regimen.  He takes aspirin for antiplatelet therapy and Praluent for lipid lowering.  He runs a low normal blood pressure and does not require any antihypertensive medication.  We will repeat an exercise treadmill study next year and see him back at that time. Lipids reviewed with a cholesterol of 142, HDL 52, LDL 69, triglycerides 116.  He remains on Praluent.  He is statin intolerant.  No changes are made today.      Shared Decision Making/Informed Consent The risks [chest pain, shortness of breath, cardiac arrhythmias, dizziness, blood pressure fluctuations, myocardial infarction, stroke/transient ischemic attack, and life-threatening complications (estimated to be 1 in 10,000)], benefits (risk stratification, diagnosing coronary artery disease, treatment guidance) and alternatives of an exercise tolerance test were discussed in detail with Mr. Samaan and he agrees to proceed.    Medication Adjustments/Labs and Tests Ordered: Current medicines are reviewed at length with the patient today.  Concerns regarding medicines are outlined above.  Orders Placed This Encounter  Procedures   CBC   Comprehensive metabolic panel   Lipid panel   EXERCISE TOLERANCE TEST (ETT)   No orders of the defined types were placed  in this encounter.   Patient Instructions  Medication Instructions:  Your physician recommends that you continue on your current medications as directed. Please refer to the Current Medication list given to you today.  *If you need a refill on your cardiac medications before your next appointment, please call your pharmacy*   Lab Work: CBC, CMET, LIPIDS (prior to next visit) If you have labs (blood work) drawn today and your tests are completely normal, you will receive your results only by: MyChart Message (if you have MyChart) OR A paper copy in the mail If you have any lab test that is abnormal or we need to change your treatment, we will call you to review the results.   Testing/Procedures: Exercise Treadmill test (in 1 year, prior to visit) Your physician has requested that you have an exercise tolerance test. For further information please visit [email or web address] Please also follow instruction sheet, as given.   Follow-Up: At [facility], you and your health needs are our priority.  As part of our continuing mission to provide you with exceptional heart care, we have created designated Provider Care Teams.  These Care Teams include your primary Cardiologist (physician) and Advanced Practice Providers (APPs -  Physician Assistants and Nurse Practitioners) who all work together to provide you with the care you need, when you need it.  We recommend signing up for the patient portal called "MyChart".  Sign up information is provided on this After Visit Summary.  MyChart is used to connect with patients for Virtual Visits (Telemedicine).  Patients are able to view lab/test results, encounter notes, upcoming appointments, etc.  Non-urgent messages can be sent to your provider as well.   To learn more about what you can do with MyChart, go to [email or web address].    Your next appointment:   1 year(s)  The format for your next appointment:   In Person  Provider:   [name], MD      Important Information About Sugar         Signed, [name], MD  11/18/2021 5:11 PM    [facility]

## 2021-11-24 ENCOUNTER — Ambulatory Visit: Payer: BC Managed Care – PPO | Admitting: Cardiovascular Disease

## 2022-06-28 ENCOUNTER — Other Ambulatory Visit: Payer: Self-pay | Admitting: Cardiovascular Disease

## 2022-06-28 DIAGNOSIS — E785 Hyperlipidemia, unspecified: Secondary | ICD-10-CM

## 2022-06-28 DIAGNOSIS — I2510 Atherosclerotic heart disease of native coronary artery without angina pectoris: Secondary | ICD-10-CM

## 2022-09-08 ENCOUNTER — Other Ambulatory Visit (HOSPITAL_COMMUNITY): Payer: Self-pay

## 2022-09-08 ENCOUNTER — Telehealth: Payer: Self-pay

## 2022-09-08 NOTE — Telephone Encounter (Signed)
Pharmacy Patient Advocate Encounter   Received notification from [facility]  that prior authorization for PRALUENT  is required/requested.   PA submitted to [facility] via onbase/fax   Status is pending

## 2022-09-08 NOTE — Telephone Encounter (Signed)
Received fax on Onbase that Praluent is approved until 09/08/23. Letter in media tab

## 2022-10-08 ENCOUNTER — Encounter: Payer: Self-pay | Admitting: Cardiovascular Disease

## 2022-10-26 ENCOUNTER — Ambulatory Visit: Payer: BC Managed Care – PPO | Attending: Cardiovascular Disease

## 2022-10-26 DIAGNOSIS — I2510 Atherosclerotic heart disease of native coronary artery without angina pectoris: Secondary | ICD-10-CM

## 2022-10-26 DIAGNOSIS — E782 Mixed hyperlipidemia: Secondary | ICD-10-CM | POA: Diagnosis not present

## 2022-10-26 LAB — EXERCISE TOLERANCE TEST
Angina Index: 0
Estimated workload: 13.4
Exercise duration (min): 12 min
Exercise duration (sec): 0 s
MPHR: 160 {beats}/min
Peak HR: 151 {beats}/min
Percent HR: 94 %
RPE: 15
Rest HR: 68 {beats}/min

## 2022-10-28 ENCOUNTER — Ambulatory Visit: Payer: BC Managed Care – PPO | Attending: Cardiovascular Disease

## 2022-10-28 DIAGNOSIS — I2510 Atherosclerotic heart disease of native coronary artery without angina pectoris: Secondary | ICD-10-CM

## 2022-10-28 DIAGNOSIS — E782 Mixed hyperlipidemia: Secondary | ICD-10-CM

## 2022-10-28 LAB — LIPID PANEL
Chol/HDL Ratio: 3 ratio (ref 0.0–5.0)
Cholesterol, Total: 161 mg/dL (ref 100–199)
HDL: 53 mg/dL (ref >39)
LDL Chol Calc (NIH): 85 mg/dL (ref 0–99)
Triglycerides: 132 mg/dL (ref 0–149)
VLDL Cholesterol Cal: 23 mg/dL (ref 5–40)

## 2022-10-28 LAB — CBC

## 2022-10-28 LAB — COMPREHENSIVE METABOLIC PANEL
ALT: 13 IU/L (ref 0–44)
AST: 17 IU/L (ref 0–40)
Albumin: 4.4 g/dL (ref 3.8–4.9)
Alkaline Phosphatase: 64 IU/L (ref 44–121)
BUN/Creatinine Ratio: 17 (ref 10–24)
BUN: 19 mg/dL (ref 8–27)
Bilirubin Total: 0.7 mg/dL (ref 0.0–1.2)
CO2: 26 mmol/L (ref 20–29)
Calcium: 9.4 mg/dL (ref 8.6–10.2)
Chloride: 103 mmol/L (ref 96–106)
Creatinine, Ser: 1.13 mg/dL (ref 0.76–1.27)
Globulin, Total: 2.6 g/dL (ref 1.5–4.5)
Glucose: 93 mg/dL (ref 70–99)
Potassium: 4.3 mmol/L (ref 3.5–5.2)
Sodium: 139 mmol/L (ref 134–144)
Total Protein: 7 g/dL (ref 6.0–8.5)
eGFR: 74 mL/min/{1.73_m2} (ref >59)

## 2022-11-01 ENCOUNTER — Ambulatory Visit: Payer: BC Managed Care – PPO | Admitting: Cardiovascular Disease

## 2022-11-15 ENCOUNTER — Encounter: Payer: Self-pay | Admitting: Cardiovascular Disease

## 2022-11-15 ENCOUNTER — Ambulatory Visit: Payer: BC Managed Care – PPO | Attending: Cardiovascular Disease | Admitting: Cardiovascular Disease

## 2022-11-15 VITALS — BP 122/70 | HR 53 | Ht 71.0 in | Wt 195.0 lb

## 2022-11-15 DIAGNOSIS — I2510 Atherosclerotic heart disease of native coronary artery without angina pectoris: Secondary | ICD-10-CM | POA: Diagnosis not present

## 2022-11-15 DIAGNOSIS — E782 Mixed hyperlipidemia: Secondary | ICD-10-CM

## 2022-11-15 NOTE — Patient Instructions (Addendum)
Medication Instructions:  Your physician recommends that you continue on your current medications as directed. Please refer to the Current Medication list given to you today.  *If you need a refill on your cardiac medications before your next appointment, please call your pharmacy*   Lab Work: Lipids, Liver in 3 months If you have labs (blood work) drawn today and your tests are completely normal, you will receive your results only by: MyChart Message (if you have MyChart) OR A paper copy in the mail If you have any lab test that is abnormal or we need to change your treatment, we will call you to review the results.   Testing/Procedures: Exercise Tolerance Test  Your physician has requested that you have an exercise tolerance test. For further information please visit [email or web address] Please also follow instruction sheet, as given.  Follow-Up: At [facility], you and your health needs are our priority.  As part of our continuing mission to provide you with exceptional heart care, we have created designated Provider Care Teams.  These Care Teams include your primary Cardiologist (physician) and Advanced Practice Providers (APPs -  Physician Assistants and Nurse Practitioners) who all work together to provide you with the care you need, when you need it.  We recommend signing up for the patient portal called "MyChart".  Sign up information is provided on this After Visit Summary.  MyChart is used to connect with patients for Virtual Visits (Telemedicine).  Patients are able to view lab/test results, encounter notes, upcoming appointments, etc.  Non-urgent messages can be sent to your provider as well.   To learn more about what you can do with MyChart, go to [email or web address].    Your next appointment:   1 year(s)  Provider:   [name], MD

## 2022-11-15 NOTE — Progress Notes (Signed)
Cardiology Office Note:    Date:  11/15/2022   ID:  Jimmy Goodwin, DOB [birth year], MRN [id number]  PCP:  Loyal Jacobson, MD   [facility] Providers Cardiologist:  [name], MD     Referring MD: Loyal Jacobson, MD   Chief Complaint  Patient presents with   Coronary Artery Disease    History of Present Illness:    Jimmy Goodwin is a 62 y.o. male with a hx of CAD. He initially presented with an inferior STEMI in 2015 and was treated with primary PCI of the distal RCA with a drug-eluting stent. He underwent staged PCI of the left circumflex during his index hospitalization. Post-MI LV function was mildly depressed with an EF of 45%, but this normalized on follow-up. Re-look cardiac cath was done to evaluate recurrent symptoms and his stents were patent.   The patient is statin intolerant and he is treated with Alirocumab.  His LDL cholesterol has been at goal over time, but recently his labs demonstrated LDL above goal 85.  He reports no change in his diet or medication compliance.  He feels well and denies any exertional chest pain, chest pressure, shortness of breath.  He remains physically active with regular exercise and no exertional symptoms.  Past Medical History:  Diagnosis Date   Acute MI (HCC) 10/30/13   CAD (coronary artery disease), native coronary artery 10/30/2013   a. inf STEMI (8/15) >> DES to RCA and staged PCI with DES to CFX;  b. re-look  LHC (8/15):  Proximal LAD 40%, circumflex stent patent, mid RCA 30-40%, distal RCA stent patent, ostial PDA 50-60%, EF 55-60%, inferior HK   Dyslipidemia 10/30/2013   Ischemic cardiomyopathy 10/30/2013   EF 45% akinesis on the entire inferior wall and severe hypokinesis of the basal and mid inferolateral walls     Past Surgical History:  Procedure Laterality Date   CORONARY ANGIOPLASTY WITH STENT PLACEMENT  10/31/2013   Staged PCI proximal Circumflex with 3.5 x 12 mm Promus DES   LEFT HEART CATH N/A 10/30/2013   Procedure: LEFT  HEART CATH;  Surgeon: Micheline Chapman, MD;  Location: [facility] CATH LAB;  Service: Cardiovascular;  Laterality: N/A;   LEFT HEART CATHETERIZATION WITH CORONARY ANGIOGRAM N/A 11/23/2013   Procedure: LEFT HEART CATHETERIZATION WITH CORONARY ANGIOGRAM;  Surgeon: Micheline Chapman, MD;  Location: [facility] CATH LAB;  Service: Cardiovascular;  Laterality: N/A;   PERCUTANEOUS CORONARY STENT INTERVENTION (PCI-S)  10/30/2013   Procedure: PERCUTANEOUS CORONARY STENT INTERVENTION (PCI-S);  Surgeon: Micheline Chapman, MD;  Location: [facility] CATH LAB;  Service: Cardiovascular;;  RCA   PERCUTANEOUS CORONARY STENT INTERVENTION (PCI-S) N/A 10/31/2013   Procedure: PERCUTANEOUS CORONARY STENT INTERVENTION (PCI-S);  Surgeon: Kathleene Hazel, MD;  Location: [facility] CATH LAB;  Service: Cardiovascular;  Laterality: N/A;   STEMI  10/30/2013   PCI distal RCA with 3.5x20 mm Promus DES   TONSILLECTOMY      Current Medications: Current Meds  Medication Sig   Alirocumab (PRALUENT) 75 MG/ML SOAJ INJECT 1 PEN INTO THE SKIN EVERY 14 (FOURTEEN) DAYS.   aspirin EC 81 MG EC tablet Take 1 tablet (81 mg total) by mouth daily.   dexamethasone (DECADRON) 0.5 MG/5ML solution Take 5 mLs by mouth as directed. Swish and spit 5 ml once daily as needed for mouth   fluocinonide gel (LIDEX) 0.05 % Apply 1 application topically daily as needed. mouth   hydrocortisone 2.5 % cream Apply topically 2 (two) times daily as needed.  Allergies:   Lipitor [atorvastatin], Nitroglycerin, and Repatha [evolocumab]   Social History   Socioeconomic History   Marital status: Married    Spouse name: Not on file   Number of children: Not on file   Years of education: Not on file   Highest education level: Not on file  Occupational History   Not on file  Tobacco Use   Smoking status: Never   Smokeless tobacco: Never  Substance and Sexual Activity   Alcohol use: Yes   Drug use: No   Sexual activity: Not on file  Other Topics Concern   Not on file  Social  History Narrative   Not on file   Social Determinants of Health   Financial Resource Strain: Not on file  Food Insecurity: Not on file  Transportation Needs: Not on file  Physical Activity: Not on file  Stress: Not on file  Social Connections: Unknown (08/07/2021)   Received from [facility]   Social Network    Social Network: Not on file     Family History: The patient's family history includes Hyperlipidemia in his father and mother.  ROS:   Please see the history of present illness.    All other systems reviewed and are negative.  EKGs/Labs/Other Studies Reviewed:    The following studies were reviewed today: EKG Interpretation Date/Time:  Monday November 15 2022 10:13:41 EDT Ventricular Rate:  53 PR Interval:  164 QRS Duration:  96 QT Interval:  434 QTC Calculation: 407 R Axis:   67  Text Interpretation: Sinus bradycardia When compared with ECG of 06-Oct-2018 14:53, No significant change was found Confirmed by [name] [phone]) on 11/15/2022 10:27:45 AM    Recent Labs: 10/28/2022: ALT 13; BUN 19; Creatinine, Ser 1.13; Hemoglobin 14.5; Platelets 160; Potassium 4.3; Sodium 139  Recent Lipid Panel    Component Value Date/Time   CHOL 161 10/28/2022 1008   TRIG 132 10/28/2022 1008   HDL 53 10/28/2022 1008   CHOLHDL 3.0 10/28/2022 1008   CHOLHDL 2.9 10/21/2015 0809   VLDL 15 10/21/2015 0809   LDLCALC 85 10/28/2022 1008   LDLDIRECT 62 05/07/2015 0828     Risk Assessment/Calculations:                Physical Exam:    VS:  BP 122/70   Pulse (!) 53   Ht 5\' 11"  (1.803 m)   Wt 195 lb (88.5 kg)   SpO2 97%   BMI 27.20 kg/m     Wt Readings from Last 3 Encounters:  11/15/22 195 lb (88.5 kg)  11/18/21 195 lb (88.5 kg)  10/21/20 186 lb (84.4 kg)     GEN:  Well nourished, well developed in no acute distress HEENT: Normal NECK: No JVD; No carotid bruits LYMPHATICS: No lymphadenopathy CARDIAC: RRR, no murmurs, rubs, gallops RESPIRATORY:   Clear to auscultation without rales, wheezing or rhonchi  ABDOMEN: Soft, non-tender, non-distended MUSCULOSKELETAL:  No edema; No deformity  SKIN: Warm and dry NEUROLOGIC:  Alert and oriented x 3 PSYCHIATRIC:  Normal affect   ASSESSMENT:    1. Coronary artery disease involving native coronary artery of native heart without angina pectoris   2. Mixed hyperlipidemia    PLAN:    In order of problems listed above:  The patient is stable without angina.  He maintains an excellent daily.  The patient is normotensive.  He is on Praluent for lipid lowering.  His exercise tolerance test is reviewed and he had excellent exercise capacity  no ischemic EKG changes or symptoms.  Medical program and see him back in 1 year for follow-up evaluation.  Will check an exercise tolerance test again next year when I see him back. Lipids have been in a good range.  He is going to try to tighten up his diet.  He remains on Praluent.  His LDL has been at goal now for several years.  I am going to repeat lipids and LFTs in 3 months to make sure this lab test is not spurious.  If his LDL remains greater than 70 mg/dL.  Will consider adding Zetia.  The patient is statin intolerant.      Informed Consent   Shared Decision Making/Informed Consent The risks [chest pain, shortness of breath, cardiac arrhythmias, dizziness, blood pressure fluctuations, myocardial infarction, stroke/transient ischemic attack, and life-threatening complications (estimated to be 1 in 10,000)], benefits (risk stratification, diagnosing coronary artery disease, treatment guidance) and alternatives of an exercise tolerance test were discussed in detail with Mr. Kubas and he agrees to proceed.       Medication Adjustments/Labs and Tests Ordered: Current medicines are reviewed at length with the patient today.  Concerns regarding medicines are outlined above.  Orders Placed This Encounter  Procedures   Lipid panel   Hepatic function panel    EKG 12-Lead   No orders of the defined types were placed in this encounter.   Patient Instructions  Medication Instructions:  Your physician recommends that you continue on your current medications as directed. Please refer to the Current Medication list given to you today.  *If you need a refill on your cardiac medications before your next appointment, please call your pharmacy*   Lab Work: Lipids, Liver in 3 months If you have labs (blood work) drawn today and your tests are completely normal, you will receive your results only by: MyChart Message (if you have MyChart) OR A paper copy in the mail If you have any lab test that is abnormal or we need to change your treatment, we will call you to review the results.   Testing/Procedures: NONE   Follow-Up: At [facility], you and your health needs are our priority.  As part of our continuing mission to provide you with exceptional heart care, we have created designated Provider Care Teams.  These Care Teams include your primary Cardiologist (physician) and Advanced Practice Providers (APPs -  Physician Assistants and Nurse Practitioners) who all work together to provide you with the care you need, when you need it.  We recommend signing up for the patient portal called "MyChart".  Sign up information is provided on this After Visit Summary.  MyChart is used to connect with patients for Virtual Visits (Telemedicine).  Patients are able to view lab/test results, encounter notes, upcoming appointments, etc.  Non-urgent messages can be sent to your provider as well.   To learn more about what you can do with MyChart, go to [email or web address].    Your next appointment:   1 year(s)  Provider:   [name], MD        Signed, [name], MD  11/15/2022 10:40 AM    [facility]

## 2023-02-16 ENCOUNTER — Ambulatory Visit: Payer: BC Managed Care – PPO | Attending: Cardiovascular Disease

## 2023-02-16 DIAGNOSIS — E782 Mixed hyperlipidemia: Secondary | ICD-10-CM

## 2023-02-16 DIAGNOSIS — I2510 Atherosclerotic heart disease of native coronary artery without angina pectoris: Secondary | ICD-10-CM

## 2023-02-17 LAB — HEPATIC FUNCTION PANEL
ALT: 16 [IU]/L (ref 0–44)
AST: 19 [IU]/L (ref 0–40)
Albumin: 4.4 g/dL (ref 3.9–4.9)
Alkaline Phosphatase: 65 [IU]/L (ref 44–121)
Bilirubin Total: 0.6 mg/dL (ref 0.0–1.2)
Bilirubin, Direct: 0.22 mg/dL (ref 0.00–0.40)
Total Protein: 6.7 g/dL (ref 6.0–8.5)

## 2023-02-17 LAB — LIPID PANEL
Chol/HDL Ratio: 2.7 ratio (ref 0.0–5.0)
Cholesterol, Total: 132 mg/dL (ref 100–199)
HDL: 49 mg/dL (ref >39)
LDL Chol Calc (NIH): 66 mg/dL (ref 0–99)
Triglycerides: 91 mg/dL (ref 0–149)
VLDL Cholesterol Cal: 17 mg/dL (ref 5–40)

## 2023-06-16 ENCOUNTER — Other Ambulatory Visit: Payer: Self-pay | Admitting: Cardiovascular Disease

## 2023-06-16 DIAGNOSIS — E785 Hyperlipidemia, unspecified: Secondary | ICD-10-CM

## 2023-06-16 DIAGNOSIS — I2510 Atherosclerotic heart disease of native coronary artery without angina pectoris: Secondary | ICD-10-CM

## 2023-06-28 ENCOUNTER — Telehealth: Payer: Self-pay | Admitting: Cardiovascular Disease

## 2023-06-28 NOTE — Telephone Encounter (Signed)
 Pt c/o medication issue:  1. Name of Medication:   Alirocumab (PRALUENT) 75 MG/ML SOAJ   2. How are you currently taking this medication (dosage and times per day)?   Yes  3. Are you having a reaction (difficulty breathing--STAT)?   4. What is your medication issue?   Patient needs assistance getting this medication.

## 2023-06-30 ENCOUNTER — Other Ambulatory Visit (HOSPITAL_COMMUNITY): Payer: Self-pay

## 2023-06-30 NOTE — Telephone Encounter (Signed)
 Per test claim refill too soon, pharmacy filled on 06/20/23 next fill due on 08/22/23

## 2023-07-01 ENCOUNTER — Encounter: Payer: Self-pay | Admitting: Pharmacist

## 2023-07-01 ENCOUNTER — Other Ambulatory Visit (HOSPITAL_COMMUNITY): Payer: Self-pay

## 2023-07-01 ENCOUNTER — Telehealth: Payer: Self-pay | Admitting: Pharmacy Technician

## 2023-07-01 NOTE — Telephone Encounter (Signed)
 Pharmacy Patient Advocate Encounter   Enrolled the patient in a coupon copay card, called the pharmacy to give the information and it brought it down to $50.  Card with information :

## 2023-07-01 NOTE — Telephone Encounter (Signed)
 Pt is calling back requesting a call from [facility]?? to discuss

## 2023-07-28 ENCOUNTER — Telehealth: Payer: Self-pay | Admitting: Cardiovascular Disease

## 2023-07-28 NOTE — Telephone Encounter (Signed)
 Spoke with pt who reports he is calling to get on Dr [employer] schedule.  Pt is scheduled for stress testing 11/01/2023 as ordered by Dr Arlester Ladd.

## 2023-07-28 NOTE — Telephone Encounter (Signed)
 Patient is requesting to speak with Dr. Katheryne Pane nurse.

## 2023-08-02 NOTE — Telephone Encounter (Signed)
 Called and spoke with patient. Scheduled for 11/14/23.

## 2023-09-20 ENCOUNTER — Other Ambulatory Visit: Payer: Self-pay | Admitting: Cardiovascular Disease

## 2023-09-20 DIAGNOSIS — E785 Hyperlipidemia, unspecified: Secondary | ICD-10-CM

## 2023-09-20 DIAGNOSIS — I2510 Atherosclerotic heart disease of native coronary artery without angina pectoris: Secondary | ICD-10-CM

## 2023-09-21 ENCOUNTER — Telehealth: Payer: Self-pay | Admitting: Pharmacy Technician

## 2023-09-21 ENCOUNTER — Other Ambulatory Visit: Payer: Self-pay | Admitting: Cardiovascular Disease

## 2023-09-21 ENCOUNTER — Other Ambulatory Visit (HOSPITAL_COMMUNITY): Payer: Self-pay

## 2023-09-21 DIAGNOSIS — I2510 Atherosclerotic heart disease of native coronary artery without angina pectoris: Secondary | ICD-10-CM

## 2023-09-21 DIAGNOSIS — E785 Hyperlipidemia, unspecified: Secondary | ICD-10-CM

## 2023-09-21 MED ORDER — PRALUENT 75 MG/ML ~~LOC~~ SOAJ: 75.0000 mg | SUBCUTANEOUS | 0 refills | Status: DC

## 2023-09-21 NOTE — Telephone Encounter (Addendum)
 Pharmacy Patient Advocate Encounter   Received notification from RX Request Messages-Chris that prior authorization for Praluent  75mg  is required/requested.   Insurance verification completed.   The patient is insured through [facility] .   Per test claim: PA required; PA submitted to above mentioned insurance via CoverMyMeds Key/confirmation #/EOC [id number] Status is pending

## 2023-09-21 NOTE — Telephone Encounter (Signed)
 Pharmacy Patient Advocate Encounter  Received notification from [facility] that Prior Authorization for praluent  75mg  has been APPROVED from 09/21/23 to 09/20/24. Per another encounter, patient has coupon that [facility] be at [facility]

## 2023-09-21 NOTE — Telephone Encounter (Signed)
 Patient is on praluent  now

## 2023-09-21 NOTE — Addendum Note (Signed)
 Addended by: DARRELL BRUCKNER on: 09/21/2023 02:51 PM   Modules accepted: Orders

## 2023-10-24 ENCOUNTER — Other Ambulatory Visit: Payer: Self-pay

## 2023-10-24 ENCOUNTER — Telehealth: Payer: Self-pay | Admitting: Cardiovascular Disease

## 2023-10-24 DIAGNOSIS — I255 Ischemic cardiomyopathy: Secondary | ICD-10-CM

## 2023-10-24 DIAGNOSIS — Z79899 Other long term (current) drug therapy: Secondary | ICD-10-CM

## 2023-10-24 DIAGNOSIS — I2510 Atherosclerotic heart disease of native coronary artery without angina pectoris: Secondary | ICD-10-CM

## 2023-10-24 DIAGNOSIS — E785 Hyperlipidemia, unspecified: Secondary | ICD-10-CM

## 2023-10-24 NOTE — Telephone Encounter (Signed)
 Patient called to get lab orders prior to his visit with Dr. Wonda on 8/18.

## 2023-10-24 NOTE — Telephone Encounter (Signed)
 Spoke with Pt. Labs released and pt understands he may go to any [facility] location.

## 2023-10-25 ENCOUNTER — Telehealth (HOSPITAL_COMMUNITY): Payer: Self-pay

## 2023-10-25 NOTE — Telephone Encounter (Signed)
 Detailed instructions left on the patient's answering machine. S.Jassiel Flye CCT

## 2023-10-31 LAB — CBC
Hematocrit: 42.8 % (ref 37.5–51.0)
Hemoglobin: 14.4 g/dL (ref 13.0–17.7)
MCH: 31.7 pg (ref 26.6–33.0)
MCHC: 33.6 g/dL (ref 31.5–35.7)
MCV: 94 fL (ref 79–97)
Platelets: 153 x10E3/uL (ref 150–450)
RBC: 4.54 x10E6/uL (ref 4.14–5.80)
RDW: 13.1 % (ref 11.6–15.4)
WBC: 3.7 x10E3/uL (ref 3.4–10.8)

## 2023-11-01 ENCOUNTER — Ambulatory Visit (HOSPITAL_COMMUNITY)
Admission: RE | Admit: 2023-11-01 | Discharge: 2023-11-01 | Disposition: A | Discharge status: Home / Self Care | Payer: Self-pay | Source: Ambulatory Visit | Attending: Cardiology | Admitting: Cardiology

## 2023-11-01 ENCOUNTER — Ambulatory Visit: Payer: Self-pay

## 2023-11-01 DIAGNOSIS — E782 Mixed hyperlipidemia: Secondary | ICD-10-CM | POA: Insufficient documentation

## 2023-11-01 DIAGNOSIS — I2510 Atherosclerotic heart disease of native coronary artery without angina pectoris: Secondary | ICD-10-CM | POA: Diagnosis not present

## 2023-11-01 LAB — COMPREHENSIVE METABOLIC PANEL WITH GFR
ALT: 14 IU/L (ref 0–44)
AST: 15 IU/L (ref 0–40)
Albumin: 4.2 g/dL (ref 3.9–4.9)
Alkaline Phosphatase: 68 IU/L (ref 44–121)
BUN/Creatinine Ratio: 19 (ref 10–24)
BUN: 22 mg/dL (ref 8–27)
Bilirubin Total: 0.6 mg/dL (ref 0.0–1.2)
CO2: 22 mmol/L (ref 20–29)
Calcium: 9.1 mg/dL (ref 8.6–10.2)
Chloride: 102 mmol/L (ref 96–106)
Creatinine, Ser: 1.15 mg/dL (ref 0.76–1.27)
Globulin, Total: 2.5 g/dL (ref 1.5–4.5)
Glucose: 91 mg/dL (ref 70–99)
Potassium: 4.4 mmol/L (ref 3.5–5.2)
Sodium: 140 mmol/L (ref 134–144)
Total Protein: 6.7 g/dL (ref 6.0–8.5)
eGFR: 72 mL/min/1.73 (ref >59)

## 2023-11-01 LAB — LIPID PANEL
Chol/HDL Ratio: 3.1 ratio (ref 0.0–5.0)
Cholesterol, Total: 154 mg/dL (ref 100–199)
HDL: 49 mg/dL (ref >39)
LDL Chol Calc (NIH): 79 mg/dL (ref 0–99)
Triglycerides: 148 mg/dL (ref 0–149)
VLDL Cholesterol Cal: 26 mg/dL (ref 5–40)

## 2023-11-02 LAB — EXERCISE TOLERANCE TEST
Angina Index: 0
Duke Treadmill Score: 1
Estimated workload: 12.9
Exercise duration (min): 10 min
Exercise duration (sec): 45 s
MPHR: 159 {beats}/min
Peak HR: 162 {beats}/min
Percent HR: 102 %
RPE: 18
Rest HR: 58 {beats}/min
ST Depression (mm): 2 mm

## 2023-11-14 ENCOUNTER — Encounter: Payer: Self-pay | Admitting: Cardiovascular Disease

## 2023-11-14 ENCOUNTER — Ambulatory Visit: Payer: Self-pay | Attending: Cardiovascular Disease | Admitting: Cardiovascular Disease

## 2023-11-14 VITALS — BP 100/66 | HR 61 | Ht 71.0 in | Wt 198.0 lb

## 2023-11-14 DIAGNOSIS — I2510 Atherosclerotic heart disease of native coronary artery without angina pectoris: Secondary | ICD-10-CM

## 2023-11-14 DIAGNOSIS — E782 Mixed hyperlipidemia: Secondary | ICD-10-CM

## 2023-11-14 NOTE — Patient Instructions (Addendum)
 Medication Instructions:  NONE   *If you need a refill on your cardiac medications before your next appointment, please call your pharmacy*  Lab Work: LIPID PANEL IN 2 MONTHS   If you have labs (blood work) drawn today and your tests are completely normal, you will receive your results only by: MyChart Message (if you have MyChart) OR A paper copy in the mail If you have any lab test that is abnormal or we need to change your treatment, we will call you to review the results.  Testing/Procedures: NONE   Follow-Up: At [facility], you and your health needs are our priority.  As part of our continuing mission to provide you with exceptional heart care, our providers are all part of one team.  This team includes your primary Cardiologist (physician) and Advanced Practice Providers or APPs (Physician Assistants and Nurse Practitioners) who all work together to provide you with the care you need, when you need it.  Your next appointment:   1 year(s) (PT AGREES TO DO EXERCISE STRESS TEST EVERY OTHER YEAR)  Provider:   Ozell Fell, MD

## 2023-11-14 NOTE — Progress Notes (Signed)
 Cardiology Office Note:    Date:  11/14/2023   ID:  Jimmy Goodwin, DOB 04/14/1960, MRN [id number]  PCP:  Millicent Sharper, MD   [facility] Providers Cardiologist:  Sharper Fell, MD     Referring MD: Millicent Sharper, MD   Chief Complaint  Patient presents with   Coronary Artery Disease    History of Present Illness:    Jimmy Goodwin is a 63 y.o. male with a hx of coronary artery disease, presenting for follow-up evaluation. He initially presented with an inferior STEMI in 2015 and was treated with primary PCI of the distal RCA with a drug-eluting stent. He underwent staged PCI of the left circumflex during his index hospitalization. Post-MI LV function was mildly depressed with an EF of 45%, but this normalized on follow-up. Re-look cardiac cath was done to evaluate recurrent symptoms and his stents were patent.   The patient is statin intolerant and he is treated with Alirocumab .   The patient is here alone today.  Well at present.  We note that his lipids were above goal on recent labs with an LDL of 79.  He has previously been at goal.  He states that he does not eat as well in the summer.  His habits are not as good.  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  He has been intolerant to statins and Repatha .  He denies any symptoms of chest pain, chest pressure, shortness of breath, or heart palpitations.  He feels well.   Current Medications: Current Meds  Medication Sig   Alirocumab  (PRALUENT ) 75 MG/ML SOAJ Inject 1 mL (75 mg total) into the skin every 14 (fourteen) days.   aspirin  EC 81 MG EC tablet Take 1 tablet (81 mg total) by mouth daily.   dexamethasone (DECADRON) 0.5 MG/5ML solution Take 5 mLs by mouth as directed. Swish and spit 5 ml once daily as needed for mouth   fluocinonide gel (LIDEX) 0.05 % Apply 1 application topically daily as needed. mouth   hydrocortisone 2.5 % cream Apply topically 2 (two)  times daily as needed.     Allergies:   Lipitor  [atorvastatin ], Nitroglycerin , and Repatha  [evolocumab ]   ROS:   Please see the history of present illness.    All other systems reviewed and are negative.  EKGs/Labs/Other Studies Reviewed:    The following studies were reviewed today: Cardiac Studies & Procedures   ______________________________________________________________________________________________   STRESS TESTS  EXERCISE TOLERANCE TEST (ETT) 11/01/2023  Interpretation Summary   Up to 2 mm upslopping depression at peak exercise in leads V4-6 that resolved <1 min into recovery. Suspect artifact. Negative stress EKG for ischemia.   Excellent exercise capacity (10:45 min:s; 12.9 METS). Normal HR/BP response to exercise.            ______________________________________________________________________________________________      EKG:   EKG Interpretation Date/Time:  Monday November 14 2023 13:40:54 EDT Ventricular Rate:  61 PR Interval:  170 QRS Duration:  94 QT Interval:  418 QTC Calculation: 420 R Axis:   37  Text Interpretation: Normal sinus rhythm Normal ECG When compared with ECG of 15-Nov-2022 10:13, No significant change was found Confirmed by Fell Sharper [phone]) on 11/14/2023 1:54:54 PM    Recent Labs: 10/31/2023: ALT 14; BUN 22; Creatinine, Ser 1.15; Hemoglobin 14.4; Platelets 153; Potassium 4.4; Sodium 140  Recent Lipid Panel    Component Value Date/Time   CHOL 154 10/31/2023 0936  TRIG 148 10/31/2023 0936   HDL 49 10/31/2023 0936   CHOLHDL 3.1 10/31/2023 0936   CHOLHDL 2.9 10/21/2015 0809   VLDL 15 10/21/2015 0809   LDLCALC 79 10/31/2023 0936   LDLDIRECT 62 05/07/2015 0828     Risk Assessment/Calculations:                Physical Exam:    VS:  BP 100/66   Pulse 61   Ht 5' 11 (1.803 m)   Wt 198 lb (89.8 kg)   SpO2 99%   BMI 27.62 kg/m     Wt Readings from Last 3 Encounters:  11/14/23 198 lb (89.8 kg)  11/01/23 195 lb  (88.5 kg)  11/15/22 195 lb (88.5 kg)     GEN:  Well nourished, well developed in no acute distress HEENT: Normal NECK: No JVD; No carotid bruits LYMPHATICS: No lymphadenopathy CARDIAC: RRR, no murmurs, rubs, gallops RESPIRATORY:  Clear to auscultation without rales, wheezing or rhonchi  ABDOMEN: Soft, non-tender, non-distended MUSCULOSKELETAL:  No edema; No deformity  SKIN: Warm and dry NEUROLOGIC:  Alert and oriented x 3 PSYCHIATRIC:  Normal affect   Assessment & Plan Coronary artery disease involving native coronary artery of native heart without angina pectoris Patient remains on Praluent  and aspirin .  Continue current management.  Stress test reviewed which was low risk with excellent exercise tolerance. Mixed hyperlipidemia Recent labs reviewed with LDL cholesterol of 79, HDL 49, total cholesterol 845.  Advise repeat labs in 2 months.  If he remains above goal, consider adding Zetia  10 mg daily or increasing Praluent  to 150 mg.     Medication Adjustments/Labs and Tests Ordered: Current medicines are reviewed at length with the patient today.  Concerns regarding medicines are outlined above.  Orders Placed This Encounter  Procedures   Lipid panel   EKG 12-Lead   No orders of the defined types were placed in this encounter.   Patient Instructions  Lab Work: Lipid panel in 2 months   If you have labs (blood work) drawn today and your tests are completely normal, you will receive your results only by: MyChart Message (if you have MyChart) OR A paper copy in the mail If you have any lab test that is abnormal or we need to change your treatment, we will call you to review the results.  Follow-Up: At [facility], you and your health needs are our priority.  As part of our continuing mission to provide you with exceptional heart care, our providers are all part of one team.  This team includes your primary Cardiologist (physician) and Advanced Practice Providers or  APPs (Physician Assistants and Nurse Practitioners) who all work together to provide you with the care you need, when you need it.  Your next appointment:   1 year(s)  Provider:   Ozell Fell, MD      Signed, Ozell Fell, MD  11/14/2023 2:10 PM    [facility]

## 2024-01-12 ENCOUNTER — Other Ambulatory Visit: Payer: Self-pay | Admitting: Physician Assistant

## 2024-01-12 ENCOUNTER — Ambulatory Visit: Payer: Self-pay | Admitting: Physician Assistant

## 2024-01-12 DIAGNOSIS — I2510 Atherosclerotic heart disease of native coronary artery without angina pectoris: Secondary | ICD-10-CM

## 2024-01-12 DIAGNOSIS — E782 Mixed hyperlipidemia: Secondary | ICD-10-CM

## 2024-01-12 DIAGNOSIS — E785 Hyperlipidemia, unspecified: Secondary | ICD-10-CM

## 2024-01-12 LAB — LIPID PANEL
Chol/HDL Ratio: 3 ratio (ref 0.0–5.0)
Cholesterol, Total: 137 mg/dL (ref 100–199)
HDL: 45 mg/dL (ref >39)
LDL Chol Calc (NIH): 71 mg/dL (ref 0–99)
Triglycerides: 118 mg/dL (ref 0–149)
VLDL Cholesterol Cal: 21 mg/dL (ref 5–40)

## 2024-01-12 MED ORDER — ALIROCUMAB 150 MG/ML ~~LOC~~ SOAJ: 150.0000 mg | SUBCUTANEOUS | 2 refills | Status: AC

## 2024-04-20 LAB — LIPID PANEL
Chol/HDL Ratio: 2.3 ratio (ref 0.0–5.0)
Cholesterol, Total: 118 mg/dL (ref 100–199)
HDL: 52 mg/dL
LDL Chol Calc (NIH): 52 mg/dL (ref 0–99)
Triglycerides: 69 mg/dL (ref 0–149)
VLDL Cholesterol Cal: 14 mg/dL (ref 5–40)
